# Patient Record
Sex: Female | Born: 1940 | Race: White | Hispanic: No | Marital: Married
Health system: Midwestern US, Academic
[De-identification: ages and names within clinical notes are randomized; demographics above are authoritative.]

---

## 2016-11-25 ENCOUNTER — Encounter: Admit: 2016-11-25 | Discharge: 2016-11-25 | Payer: MEDICARE | Primary: Adult Health

## 2016-11-25 DIAGNOSIS — I340 Nonrheumatic mitral (valve) insufficiency: Principal | ICD-10-CM

## 2017-01-05 ENCOUNTER — Encounter: Admit: 2017-01-05 | Discharge: 2017-01-05 | Payer: MEDICARE | Primary: Adult Health

## 2017-01-05 NOTE — Progress Notes
Records Request    Medical records request for continuation of care:    Patient has appointment on 01/21/17   with  Dr. Larwance Sachs* .    Please fax records to [facility]  [phone]    Request records:    Office Notes (Most recent)    Cardiac Office Notes (Most recent)    EKG's           Any Cardiac Testing    Any cardiac-related records    Recent Labs          Thank you,      [facility]  [facility]  [address]  [place], [place] [address]  Phone:  [phone]  Fax:  [phone]

## 2017-01-14 ENCOUNTER — Encounter: Admit: 2017-01-14 | Discharge: 2017-01-14 | Payer: MEDICARE | Primary: Adult Health

## 2017-01-14 DIAGNOSIS — I340 Nonrheumatic mitral (valve) insufficiency: ICD-10-CM

## 2017-01-14 DIAGNOSIS — I10 Essential (primary) hypertension: Principal | ICD-10-CM

## 2017-01-21 ENCOUNTER — Ambulatory Visit: Admit: 2017-01-21 | Discharge: 2017-01-22 | Payer: MEDICARE | Primary: Adult Health

## 2017-01-21 ENCOUNTER — Encounter: Admit: 2017-01-21 | Discharge: 2017-01-21 | Payer: MEDICARE | Primary: Adult Health

## 2017-01-21 DIAGNOSIS — I340 Nonrheumatic mitral (valve) insufficiency: ICD-10-CM

## 2017-01-21 DIAGNOSIS — I10 Essential (primary) hypertension: Principal | ICD-10-CM

## 2017-02-10 ENCOUNTER — Ambulatory Visit: Admit: 2017-02-10 | Discharge: 2017-02-11 | Payer: MEDICARE | Primary: Adult Health

## 2017-02-10 DIAGNOSIS — I10 Essential (primary) hypertension: Principal | ICD-10-CM

## 2017-02-10 DIAGNOSIS — I340 Nonrheumatic mitral (valve) insufficiency: ICD-10-CM

## 2017-03-05 ENCOUNTER — Encounter: Admit: 2017-03-05 | Discharge: 2017-03-05 | Payer: MEDICARE | Primary: Adult Health

## 2017-03-05 DIAGNOSIS — I10 Essential (primary) hypertension: Principal | ICD-10-CM

## 2017-03-05 NOTE — Telephone Encounter
Detailed message of results and recommendations left as pt has authorized us to leave messages about treatment, lab or procedure results on her phone voicemail.  I asked she call me back at [phone] should she have any further questions.

## 2017-03-05 NOTE — Telephone Encounter
-----   Message from Huel Coventry, RN sent at 03/04/2017  5:13 PM CDT -----      ----- Message -----  From: Laurence Aly, MD  Sent: 03/04/2017   4:57 PM  To: Renetta Chalk, RN, Huel Coventry, RN    Let pt know this echo looks good and that we have followup arranged.

## 2017-03-16 NOTE — Telephone Encounter
Talked with Dr. Geronimo Bootosamond and he states we can see pt in a year if she is comfortable with that, or in 6 months.  He will also see her sooner if she prefers.      I left a message letting pt know about follow-up options.  I said I would put the order in for a year and if she wanted to see him sooner, to please call.

## 2019-01-11 ENCOUNTER — Encounter: Admit: 2019-01-11 | Discharge: 2019-01-11 | Primary: Family

## 2019-01-11 NOTE — Progress Notes
Records Request    Medical records request for continuation of care:    Patient has appointment on 9.8.20   with  Dr. Aris Georgia.    Please fax records to [facility]  [phone]    Request records:      Recent Labs    Thank you,      [facility]  [facility]  [address] [place] [address]  Phone:  [phone]  Fax:  [phone]

## 2019-01-12 ENCOUNTER — Encounter: Admit: 2019-01-12 | Discharge: 2019-01-12 | Primary: Family

## 2019-01-17 ENCOUNTER — Encounter: Admit: 2019-01-17 | Discharge: 2019-01-17 | Primary: Family

## 2019-01-17 DIAGNOSIS — I10 Essential (primary) hypertension: Secondary | ICD-10-CM

## 2019-01-17 DIAGNOSIS — I340 Nonrheumatic mitral (valve) insufficiency: Secondary | ICD-10-CM

## 2019-01-17 DIAGNOSIS — Z1159 Encounter for screening for other viral diseases: Secondary | ICD-10-CM

## 2019-01-17 DIAGNOSIS — R0600 Dyspnea, unspecified: Secondary | ICD-10-CM

## 2019-01-24 LAB — COVID-19 (SARS-COV-2) PCR

## 2019-01-26 ENCOUNTER — Encounter: Admit: 2019-01-26 | Discharge: 2019-01-26 | Payer: MEDICARE | Primary: Family

## 2019-01-26 NOTE — Telephone Encounter
Called pt with COVID results.  No further needs at this time.

## 2019-01-27 ENCOUNTER — Encounter: Admit: 2019-01-27 | Discharge: 2019-01-27 | Payer: MEDICARE | Primary: Family

## 2019-01-27 ENCOUNTER — Ambulatory Visit: Admit: 2019-01-27 | Discharge: 2019-01-27 | Payer: MEDICARE | Primary: Family

## 2019-01-27 ENCOUNTER — Ambulatory Visit: Admit: 2019-01-27 | Discharge: 2019-01-28 | Payer: MEDICARE | Primary: Family

## 2019-02-07 NOTE — Telephone Encounter
Results and recommendations called to patient. She stated she will wait till she has any changes

## 2019-02-07 NOTE — Telephone Encounter
-----   Message from Binnie Kand, RN sent at 02/06/2019 12:08 PM CDT -----    ----- Message -----  From: Shan Levans, MD  Sent: 02/05/2019   1:17 PM CDT  To: Binnie Kand, RN    Let pt know this looks normal and arrange f/u as per office letter.

## 2020-05-09 ENCOUNTER — Encounter: Admit: 2020-05-09 | Discharge: 2020-05-09 | Payer: MEDICARE | Primary: Family

## 2020-05-09 DIAGNOSIS — I10 Essential (primary) hypertension: Secondary | ICD-10-CM

## 2020-05-09 DIAGNOSIS — R0600 Dyspnea, unspecified: Secondary | ICD-10-CM

## 2020-05-09 DIAGNOSIS — I340 Nonrheumatic mitral (valve) insufficiency: Secondary | ICD-10-CM

## 2020-05-14 ENCOUNTER — Encounter: Admit: 2020-05-14 | Discharge: 2020-05-14 | Payer: MEDICARE | Primary: Family

## 2020-05-29 ENCOUNTER — Ambulatory Visit: Admit: 2020-05-29 | Discharge: 2020-05-29 | Payer: MEDICARE | Primary: Family

## 2020-05-29 ENCOUNTER — Encounter: Admit: 2020-05-29 | Discharge: 2020-05-29 | Payer: MEDICARE | Primary: Family

## 2020-05-29 DIAGNOSIS — R0600 Dyspnea, unspecified: Secondary | ICD-10-CM

## 2020-05-29 DIAGNOSIS — I340 Nonrheumatic mitral (valve) insufficiency: Secondary | ICD-10-CM

## 2020-05-29 DIAGNOSIS — I10 Essential (primary) hypertension: Secondary | ICD-10-CM

## 2020-05-31 ENCOUNTER — Encounter: Admit: 2020-05-31 | Discharge: 2020-05-31 | Payer: MEDICARE | Primary: Family

## 2020-10-15 ENCOUNTER — Encounter: Admit: 2020-10-15 | Discharge: 2020-10-15 | Payer: MEDICARE | Primary: Family

## 2020-10-15 NOTE — Progress Notes
Records Request    Medical records request for continuation of care:    Patient has appointment  with  Dr. Rosamond .    Please fax records to [facility] [phone]    Request records:      Recent Labs        Thank you,      [facility]  [facility]  [address] [place] [address]  Phone:  [phone]  Fax:  [phone]

## 2020-10-22 ENCOUNTER — Encounter: Admit: 2020-10-22 | Discharge: 2020-10-22 | Payer: MEDICARE | Primary: Family

## 2020-10-22 DIAGNOSIS — I10 Essential (primary) hypertension: Secondary | ICD-10-CM

## 2020-10-22 DIAGNOSIS — I340 Nonrheumatic mitral (valve) insufficiency: Secondary | ICD-10-CM

## 2020-10-22 NOTE — Patient Instructions
Please call our Nursing line at [phone]

## 2020-10-23 ENCOUNTER — Encounter: Admit: 2020-10-23 | Discharge: 2020-10-23 | Payer: MEDICARE | Primary: Family

## 2020-10-23 MED ORDER — LISINOPRIL 10 MG PO TAB
10 mg | ORAL_TABLET | Freq: Every day | ORAL | 3 refills | Status: AC
Start: 2020-10-23 — End: ?

## 2020-10-23 NOTE — Telephone Encounter
Patient called the nursing office to report her home blood pressures after being elevated in the office. Patient states that when she got home her pressure was 164/98 with HR 59. At 4:30pm her BP was 150/95 with HR 61. Patient is asymptomatic.     Reviewed BP readings with Dr. Geronimo Boot. He recommends patient to start Lisinopril 10mg  daily. Patient is to monitor BP and HR over the next two weeks.           Called and left voicemail with recommendations. Left call back number for questions, comments, or concerns.

## 2021-07-25 IMAGING — MG MAMMOGRAM 3D DX, BILATERAL
8 series · 8 of 8 positions shown · non-contrast
Comparison: none

[R CC]
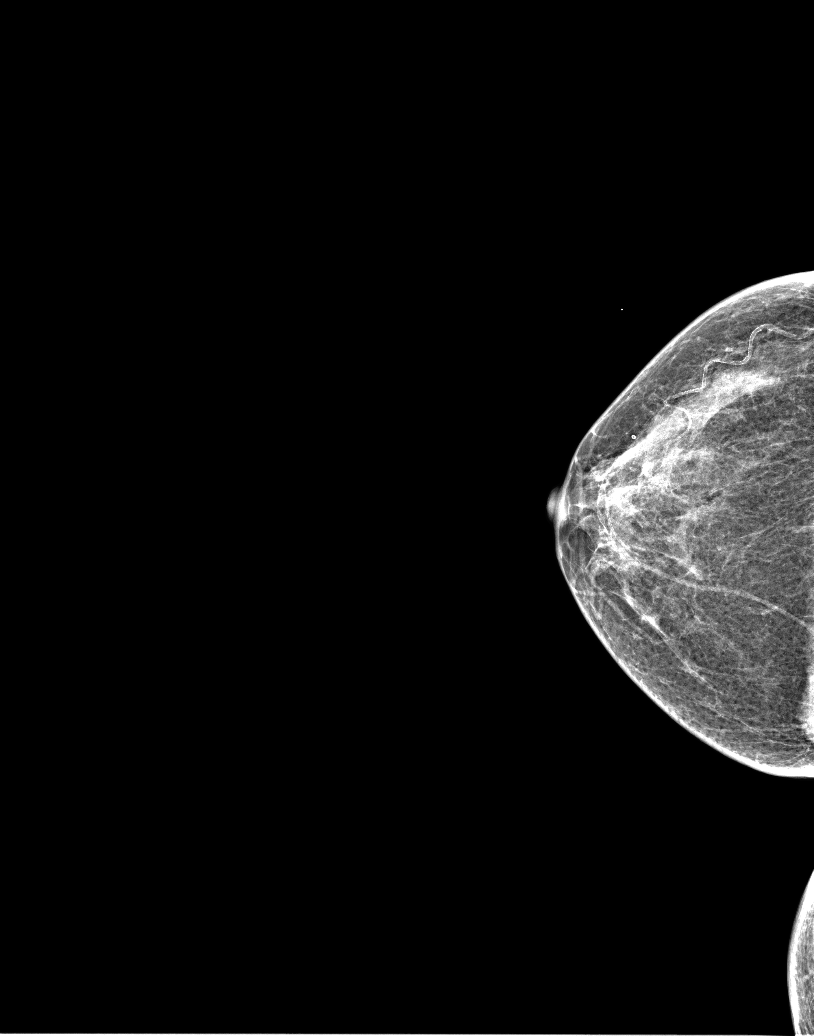

[R tomo (1 of 2)]
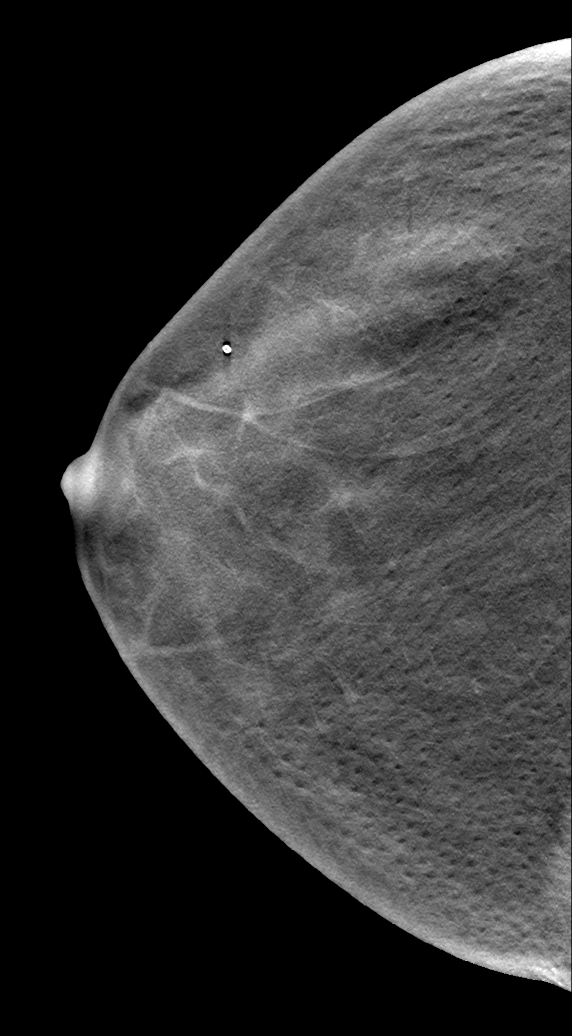

[L tomo (1 of 2)]
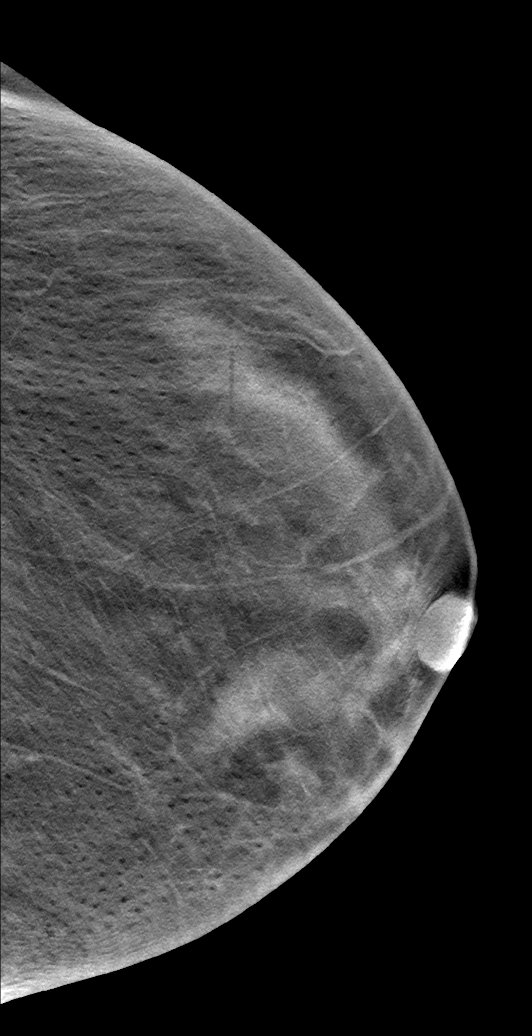

[R MLO]
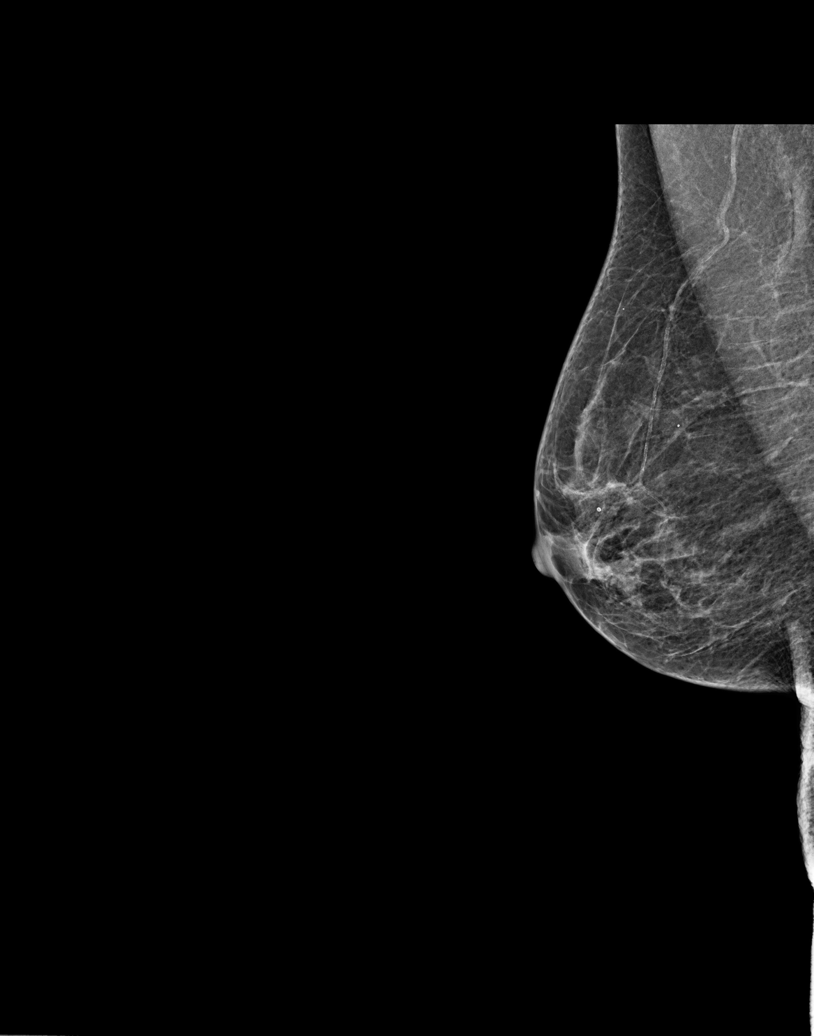

[R tomo (2 of 2)]
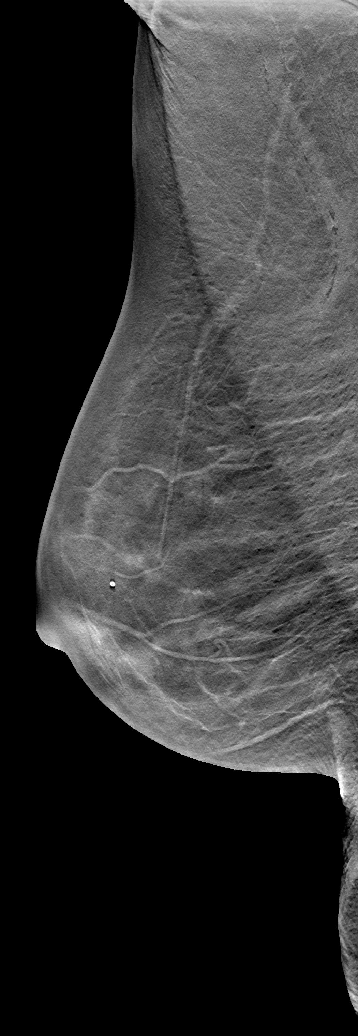

[L MLO]
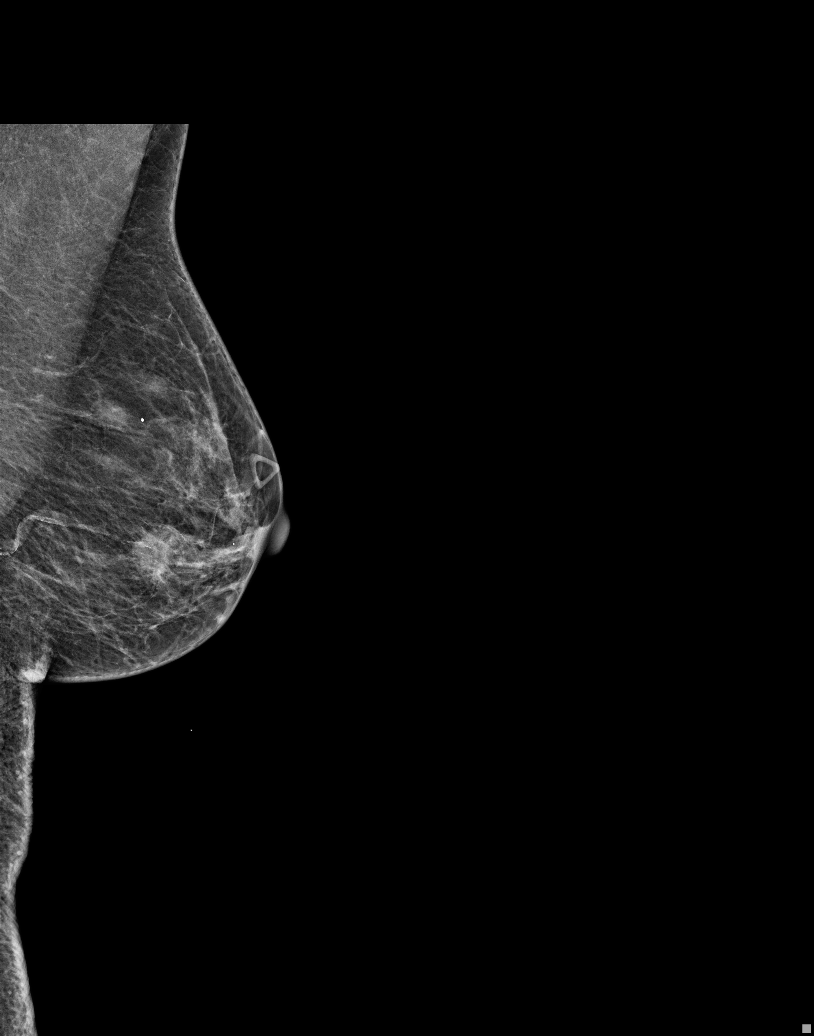

[L tomo (2 of 2)]
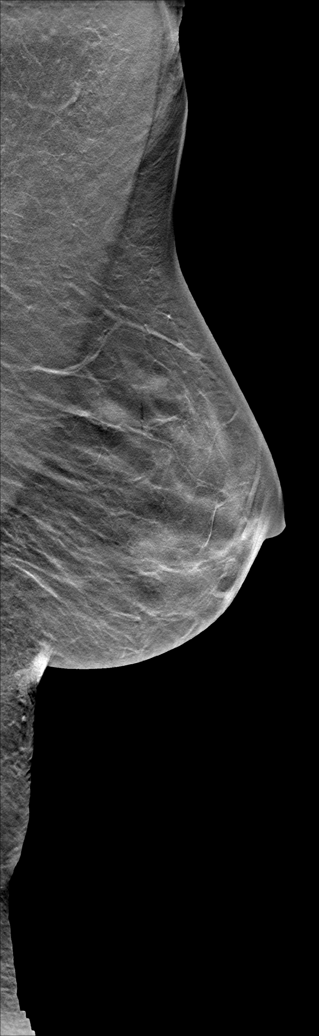

[L LM]
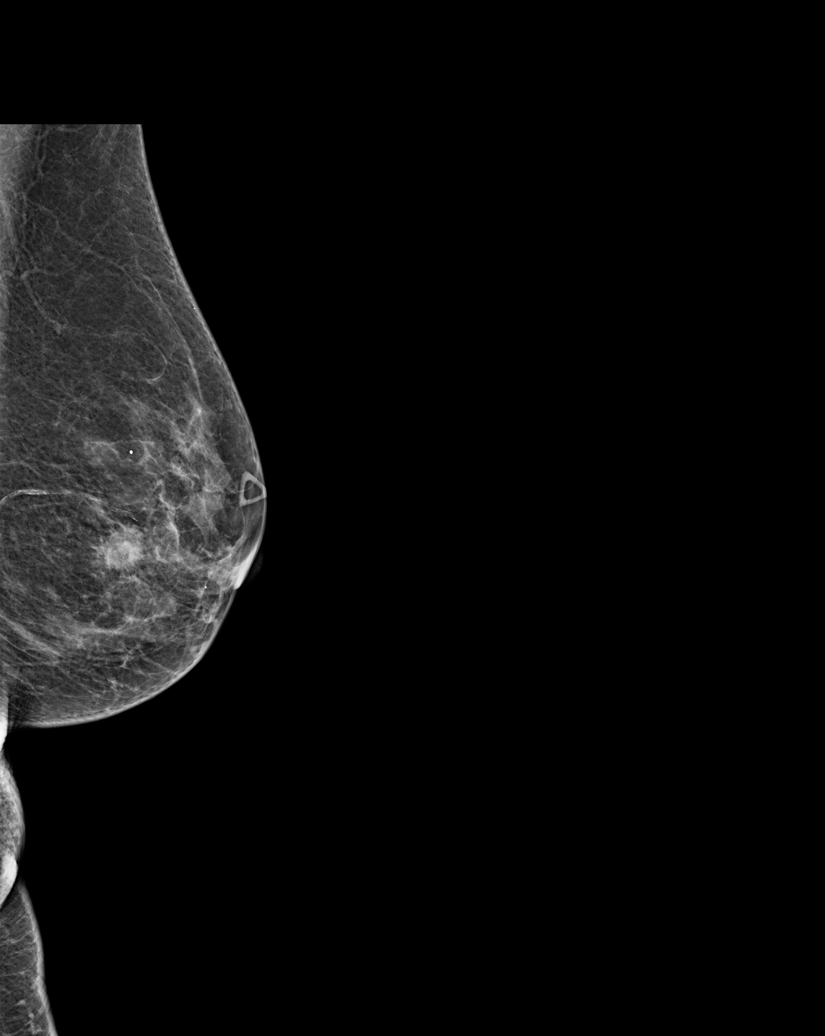

[8 of 8 positions shown; findings below may reference images not displayed]

EXAM

BILATERAL DIGITAL SCREENING MAMMOGRAM, CPT 9IRIR; WITH CAD

INDICATION

Mass of upper inner quadrant of left breast
LARGER THAN PEA SIZED LUMP, LT BREAST [DATE], X 2 MOS.  MOTHER DX @ 43.  DX BIL.  AB (3D) PRIORS:
10+ YRS AGO, NO LONGER AVAILABLE.

TECHNIQUE

Digital 2D CC and MLO projections obtained with 3D tomographic views per manufacturer's protocol.
ICAD version 7.2 was used during this exam.

COMPARISONS

There are no previous examinations available for comparison. This is a baseline study.

FINDINGS

The breasts are heterogeneously dense. This limits the sensitivity of mammography. There are no
suspicious clusters of micro calcifications.

There is a spiculated mass identified in the medial aspect of the left breast. It measures
approximately 1.3 x 1.4 x 1.8 cm. Ultrasound performed on the same day demonstrated hypoechoic
suspicious lesion.

There is no evidence of skin thickening and retraction. There is no evidence of axillary
lymphadenopathy. Benign calcifications are identified.

IMPRESSION

Spiculated mass in the medial aspect of the left breast at approximately the [DATE] position 3 cm
from the nipple measuring 1.3 x 1.4 x 1.8 cm. Further evaluation will ultrasound-guided core biopsy
and clip placement is recommended.

BI-RADS 4, SUSPICIOUS.

A reminder letter will be sent.

Biopsy should be considered.

Tech Notes:

## 2021-07-28 ENCOUNTER — Encounter: Admit: 2021-07-28 | Discharge: 2021-07-28 | Payer: MEDICARE | Primary: Family

## 2021-08-13 ENCOUNTER — Encounter: Admit: 2021-08-13 | Discharge: 2021-08-13 | Payer: MEDICARE | Primary: Family

## 2021-08-13 DIAGNOSIS — I340 Nonrheumatic mitral (valve) insufficiency: Secondary | ICD-10-CM

## 2021-08-13 DIAGNOSIS — I10 Essential (primary) hypertension: Secondary | ICD-10-CM

## 2021-08-13 NOTE — Patient Instructions
Thank you for visiting our office today.    We would like to make the following medication adjustments:  NONE      Otherwise continue the same medications as you have been doing.          We will be pursuing the following tests after your appointment today:        **Monitor blood pressure and heart rate over the next few weeks**    We will plan to see you back in 4-5 months.  Please call us in the meantime with any questions or concerns.        Please allow 5-7 business days for our providers to review your results. All normal results will go to MyChart. If you do not have Mychart, it is strongly recommended to get this so you can easily view all your results. If you do not have mychart, we will attempt to call you once with normal lab and testing results. If we cannot reach you by phone with normal results, we will send you a letter.  If you have not heard the results of your testing after one week please give [place] a call.       Your Cardiovascular Medicine [place] Team Brett Canales, Pilar Jarvis, Shawna Orleans, and [place])  phone number is [phone].

## 2021-09-22 ENCOUNTER — Encounter: Admit: 2021-09-22 | Discharge: 2021-09-22 | Payer: MEDICARE | Primary: Family

## 2021-09-22 ENCOUNTER — Emergency Department: Admit: 2021-09-22 | Discharge: 2021-09-22 | Payer: MEDICARE

## 2021-09-22 DIAGNOSIS — I340 Nonrheumatic mitral (valve) insufficiency: Secondary | ICD-10-CM

## 2021-09-22 DIAGNOSIS — I10 Essential (primary) hypertension: Secondary | ICD-10-CM

## 2021-09-22 LAB — URINALYSIS DIPSTICK REFLEX TO CULTURE
LEUKOCYTES: NEGATIVE MMOL/L (ref 21–30)
NITRITE: NEGATIVE U/L (ref 7–40)
URINE BILE: NEGATIVE mg/dL (ref 0.3–1.2)
URINE BLOOD: NEGATIVE g/dL (ref 3.5–5.0)

## 2021-09-22 LAB — URINALYSIS MICROSCOPIC REFLEX TO CULTURE

## 2021-09-22 LAB — CBC AND DIFF
ABSOLUTE BASO COUNT: 0 K/UL (ref 0–0.20)
ABSOLUTE EOS COUNT: 0.1 K/UL (ref 0–0.45)
ABSOLUTE MONO COUNT: 0.4 K/UL (ref 0–0.80)
MDW (MONOCYTE DISTRIBUTION WIDTH): 22 — ABNORMAL HIGH (ref <20.7)
WBC COUNT: 6.1 K/UL (ref 4.5–11.0)

## 2021-09-22 LAB — COMPREHENSIVE METABOLIC PANEL
ANION GAP: 10 K/UL (ref 3–12)
EGFR: >60 mL/min (ref >60)
SODIUM: 139 MMOL/L (ref 137–147)

## 2021-09-22 LAB — MAGNESIUM: MAGNESIUM: 2.1 mg/dL (ref 1.6–2.6)

## 2021-09-22 LAB — TSH WITH FREE T4 REFLEX: TSH: 13 uU/mL — ABNORMAL HIGH (ref 0.35–5.00)

## 2021-09-22 LAB — PHOSPHORUS: PHOSPHORUS: 3.8 mg/dL (ref 2.0–4.5)

## 2021-09-23 NOTE — ED Notes
To CT

## 2021-09-23 NOTE — ED Notes
Son states she has had blood work done recently and he doesn't want to do any blood work now,

## 2021-09-23 NOTE — ED Notes
Son has all belongings and will be responsible for them

## 2021-09-24 MED ADMIN — AMLODIPINE 2.5 MG PO TAB [78889]: 2.5 mg | ORAL | @ 15:00:00 | NDC 00904636961

## 2021-09-25 MED ADMIN — AMLODIPINE 2.5 MG PO TAB [78889]: 2.5 mg | ORAL | @ 15:00:00 | NDC 00904636961

## 2021-09-26 ENCOUNTER — Inpatient Hospital Stay: Admit: 2021-09-26 | Discharge: 2021-10-06 | Payer: MEDICARE | Primary: Family

## 2021-09-26 DIAGNOSIS — Z9181 History of falling: Secondary | ICD-10-CM

## 2021-09-26 DIAGNOSIS — G1221 Amyotrophic lateral sclerosis: Secondary | ICD-10-CM

## 2021-09-26 DIAGNOSIS — Z7409 Other reduced mobility: Secondary | ICD-10-CM

## 2021-09-26 DIAGNOSIS — R5381 Other malaise: Secondary | ICD-10-CM

## 2021-09-26 MED ORDER — DOCUSATE SODIUM 100 MG PO CAP
100 mg | Freq: Two times a day (BID) | ORAL | 0 refills | Status: DC
Start: 2021-09-26 — End: 2021-09-26

## 2021-09-26 MED ORDER — MAGNESIUM HYDROXIDE 400 MG/5 ML PO SUSP
30 mL | ORAL | 0 refills | Status: DC | PRN
Start: 2021-09-26 — End: 2021-09-26

## 2021-09-26 MED ORDER — OTHER MEDICATION
1 | Freq: Every day | ORAL | 0 refills | Status: DC
Start: 2021-09-26 — End: 2021-10-06

## 2021-09-26 MED ORDER — PATIENTS OWN MEDICATION
1 | Freq: Every day | ORAL | 0 refills | Status: DC
Start: 2021-09-26 — End: 2021-09-26

## 2021-09-26 MED ORDER — SENNOSIDES 8.6 MG PO TAB
2 | Freq: Every evening | ORAL | 0 refills | Status: DC
Start: 2021-09-26 — End: 2021-09-26

## 2021-09-26 MED ORDER — AMLODIPINE 5 MG PO TAB
2.5 mg | Freq: Every day | ORAL | 0 refills | Status: DC
Start: 2021-09-26 — End: 2021-10-06
  Administered 2021-09-27 – 2021-10-06 (×10): 2.5 mg via ORAL

## 2021-09-26 MED ORDER — ALUMINUM-MAGNESIUM HYDROXIDE 200-200 MG/5 ML PO SUSP
30 mL | ORAL | 0 refills | Status: DC | PRN
Start: 2021-09-26 — End: 2021-10-05

## 2021-09-26 MED ORDER — LEVOTHYROXINE 25 MCG PO TAB
25 ug | Freq: Every day | ORAL | 0 refills | Status: DC
Start: 2021-09-26 — End: 2021-09-30

## 2021-09-26 MED ORDER — ONDANSETRON 4 MG PO TBDI
4 mg | ORAL | 0 refills | Status: DC | PRN
Start: 2021-09-26 — End: 2021-10-05

## 2021-09-26 MED ORDER — ENOXAPARIN 40 MG/0.4 ML SC SYRG
40 mg | Freq: Every day | SUBCUTANEOUS | 0 refills | Status: DC
Start: 2021-09-26 — End: 2021-10-06

## 2021-09-26 MED ORDER — SODIUM CHLORIDE 0.9 % FLUSH
5-10 mL | Freq: Every day | INTRAVENOUS | 0 refills | Status: DC
Start: 2021-09-26 — End: 2021-09-29
  Administered 2021-09-27 – 2021-09-28 (×2): 10 mL via INTRAVENOUS

## 2021-09-26 MED ORDER — BISACODYL 10 MG RE SUPP
10 mg | Freq: Every day | RECTAL | 0 refills | Status: DC | PRN
Start: 2021-09-26 — End: 2021-10-06

## 2021-09-26 MED ORDER — OTHER MEDICATION
1 | ORAL | 0 refills | Status: DC | PRN
Start: 2021-09-26 — End: 2021-10-06

## 2021-09-26 MED ADMIN — AMLODIPINE 2.5 MG PO TAB [78889]: 2.5 mg | ORAL | @ 13:00:00 | Stop: 2021-09-26 | NDC 00904636961

## 2021-09-26 NOTE — Progress Notes
Patient arrived on unit via cart accompanied by transport. Patient transferred to the bed with assistance. Assessment completed, refer to flowsheet for details. Orders released, reviewed, and implemented as appropriate. Oriented to surroundings, call light within reach. Plan of care reviewed.  Will continue to monitor and assess.

## 2021-09-26 NOTE — Case Management (ED)
Notified by Particia Jasper (SW), that the patient's daughter has multiple questions re: [facility].     [id number]: Verified patient identity with two patient identifiers. Provided patient's daughter Marcelino Duster) with verbal education re: [facility].  Information included: members of the interdisciplinary team, therapy scheduling, team conference, RN to patient ration, daily physician rounds, fall prevention, appropriate clothing to have, family training, visiting hours and parking for visitors. Marcelino Duster denied any further questions and is agreeable to an admission at [facility] re[facility] facility.  Other:  1.) Marcelino Duster expressed concern that she feels her mother is "hungry" and would like staff to offer her food options and snacks, even if they are not organic. (notified charge RN of this discussion)  2.) Marcelino Duster expressed that over the past year she feels her mother has had more difficulty with talking. Marcelino Duster expressed that she felt her mother's deficits were related to motor issues than respiratory effort. "it is like her tongue is in the way and is not able to move appropriately". Marcelino Duster aware this concern will be shared with Dr. Ardelia Mems.   3.) Marcelino Duster is in agreement with email containing virtual tour of [facility] unit and further education. Patient provided email address of: [email or web address]      E.Bethzy Hauck, Inpatient Admissions Nurse/Rehab. (office# [phone] or voalte# [phone]).

## 2021-09-26 NOTE — Consults
CLINICAL NUTRITION                                                        Clinical Nutrition Initial Assessment    Name: Candace Keith   MRN: [id number]     DOB: 1941-04-03      Age: 81 y.o.  Admission Date: 09/26/2021     LOS: 0 days     Date of Service: 09/26/2021        Recommendation:  ? Continue current diet order: Regular   ? Small frequent meal attempts.   ? Order meals BID + 1/2 cartons of [employer] 1.0 or 1.4 QID.   ? Caution with oral rehydration solution, determine if contributing to caloric intake or if only filling up on fluids.  ? Trend wt weekly.   ? The nutrition-related order modifications are made in communication with the primary service, who remains responsible for the orders and overall care of the patient.     Comments:  Candace Keith is a?81 year old female?with?a past medical history of hypertension and mitral valve regurgitation/insufficiency.?Patient was?admitted on 09/22/2021 with chief complaint of generalized weakness, debility, failure to thrive, speech changes, and?frequent falls. ?Patient was brought to the [facility] by her son. Neurology was consulted. ?No evidence of stroke. 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    RD consulted on admit to rehab. Pt seen by RD during acute admission on 5/18. Met with pt and son Candace Keith at bedside this date. Discussed availability of Molli Posey 1.0 and 1.4 supplements on the unit and how to request from staff. Pt has not tried them yet and is unsure of whether she would want them room temperature or cold. Son mixing AMPED Hydrate for pt at time of RD visit. Reports pt drinks ~12 fl oz of this daily. Dietary manager met with pt and son as RD leaving to discuss foods rehab facility able to provide. Pharmacist planning to meet with pt and son later to further discuss dietary supplements wanting to be taken while admitted. Pt at acute nutritional risk and meeting malnutrition criteria.     5/18 Assessment: RD met with pt and her son at bedside. Pt able to express thoughts through pen and paper, her son participated in majority of assessment taking. He has lived with his mother for 5 yrs and more recently (2021) started encouraging an organic diet. Over the past few months-1 yr, pts appetite has declined. Went from x3 solid meals to 1-2 meals per day. Length of time to complete meals has gone from ~10 minutes to ~30 minutes most days. Pt reports fullness/ early satiety. Noted significant unintentional wt loss the past few months, pt is quite diligent with weighing herself. Total wt loss of ~18% from UBW, x2 yrs. More recently wtl oss of ~6.5% x 1 month. Pt takes many supplements, son has a list on his phone, advised him to review this with rehab team. Utilizes what sounds to be an oral rehydration solution daily, has ~16 oz of this daily. Drinks x2 protein shakes daily (PTA) Isalean PRO, 36 g protein, 280 kcal. Son also mentions use of raw goats milk daily, unsure of quantity.  Interested in utilizing organic based oral supplements, offered kate farms products as well as compleat organic blends. Discussed ONS use with inpatient rehab dietitian.     Height, Weight, BMI BMI (kg/m2) Height Weight   09/26/2021 19.76 158 cm 108 lb 0.4 oz   09/23/2021 18.55 158 cm 101 lb 6.6 oz   08/13/2021 19.75 158 cm 108 lb   10/22/2020 20.78 158 cm 113 lb 9.6 oz   05/29/2020 22.35 158 cm 122 lb 3.2 oz   05/09/2020 22.79 158 cm 124 lb 9.6 oz       Nutrition Assessment of Patient:  Admit Weight: 49 kg (5/19);  ;    BMI (Calculated): 19.76; BMI Categories Adult: Acceptable: 18.5-24.9; Appearance: Thin    Pertinent Allergies/Intolerances: none   Pertinent Labs: Vit >99, B12 499; Pertinent Meds: AMPED Hydrate, Protandim NAD/NRF1/NRF2 Synergizer, synthroid; Unintentional Weight Loss: > 5% in 1 month (severe)  Oral Diet Order: Regular; Oral Supplement: Other (Comment) [employer] 1.0 or 1.4)     Current Oral Intake: Inadequate           Estimated Calorie Needs: 1470-1715 kcals (30-35 kcals/kg current wt)  Estimated Protein Needs: 58-73 grams (1.2-1.5 g/kg current wt)    Malnutrition Assessment:   Malnutrition present on admission  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 Meals BID + 1/2 carton of kate farms 1.4 or 1.0, 4x per day.    Nutrition Focused Physical Assessment:   Loss of Subcutaneous Fat: Yes; Severity: Moderate; Location: Orbital  Muscle Wasting: Yes; Severity: Moderate; Location: Temple, Clavicle  Edema: No;  ;       Pressure Injury: none          Nutrition Diagnosis:  Unintended weight loss  Etiology: predicted suboptimal protein/energy intake in realtion to declining functional status  Signs & Symptoms: 6% unintentional wt loss x 1 month. Pt report                      Intervention / Plan:  Monitor labs, meds, intakes, and wt trends.          Goals:  Patient to consume >50% of meals/supplements  Time Frame: Throughout stay  Prevent further weight loss  Time Frame: Throughout stay             Silvio Clayman, MS, RDN, LD  Clinical Dietitian - Dept of Clinical Nutrition   Available on Voalte

## 2021-09-26 NOTE — Rehab Pre-Admission Screening
Physical Medicine & Rehabilitation Pre-Admission Screening    Candace Keith is a 81 y.o.  female.     DOB: 24-Jul-1940                  MRN#:  [id number]  Primary Insurance: [facility] MEDICARE  Financial Class: Medicare Repl  Date of Hospital Admission:  09-22-2021  Date of Expected Rehab Admission:  09-25-2021  Precautions:  Fall  Weight bearing Precautions:  [facility] Course:       Candace Keith is a 81 year old female with a past medical history of hypertension and mitral valve regurgitation/insufficiency. Patient was admitted on 09/22/2021 with chief complaint of generalized weakness, debility, failure to thrive, speech changes, and frequent falls. ?Patient was [facility] to the [facility] by her son. Neurology was consulted. ?No evidence of stroke. 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   ?  Patient has been working with physical and occupational therapy since 09-23-2021 and has been making functional gains. Patient is anticipated to return to home at the stand by assistance level of care.         Prior Level of Function         Self-Care/ADLs: Independent with homemaking w/ ambulation  Mobility: Independent Mobility in Community without Device  Work/Personal Responsibilities/Hobbies:  n/a  ?  Home Environment:  Home Situation: Lives with Family (09/24/2021  2:03 PM)   Patient Owned Equipment: Bernardo Heater (09/24/2021  2:03 PM)   Type of Home: House (09/24/2021  2:03 PM)   Entry Stairs: 1-2 Flights of Stairs; Rail on Both Sides (09/24/2021  2:03 PM)   In-Home Stairs: 1-2 Flights of Stairs; Rail on 1 Side; Able to Live on One Level (09/24/2021  2:03 PM)      Support System:  Son lives w/ pt and can provide 24/7 support         Current Level of Function    Physical Therapy: 09-25-2021  Bed Mobility/Transfer  Bed Mobility: Supine to Sit: Minimal Assist;Verbal Cues;Head of Bed Elevated;Use of Rail;Requires Extra Time;Safety Considerations;Assist with Trunk  Bed Mobility: Sit to Supine: Minimal Assist;Verbal Cues;Bed Flat;Use of Rail;Requires Extra Time;Safety Considerations  Transfer Type: Sit to/from Stand  Transfer: Assistance Level: To/From;Bed;Minimal Assist  Transfer: Assistive Device: Nurse, adult  Transfers: Type Of Assistance: Materials engineer;For Balance;For Strength Deficit;For Safety Considerations;Requires Extra Time  End Of Activity Status: Nursing Notified;Instructed Patient to Request Assist with Mobility;Instructed Patient to Use Call Light;In Bed  ?  Activity/Exercise  Exercise: Standing;RLE;LLE  Balance Exercise: Standing;Dynamic;Minimal Assist;Marching               Comments: Session focused on standing balance and LE strengthening. Intermittent UE use to maintain standing balance. physical assistance required without UE use to maintain balance. Exercises and activities include: mini squats, LE abduction, marches, and static standing balance.    Occupational Therapy: 09-25-2021  ADL Mobility  Bed Mobility: Sit to Supine: Standby assist;Verbal cues  Bed Mobility Comments: verbal cues for proper positioning  Transfer Type: Sit to/from stand  Transfer: Assistance Level: To/from;Bedside chair;Bed;Minimal assist (CGA)  Transfer: Assistive Device: Roller walker  End of Activity Status: In bed;Instructed patient to request assist with mobility;Instructed patient to use call light;Nursing notified  Transfer Comments: min A from lower surfaces  Sitting Balance: Static sitting balance;Dynamic sitting  balance;Standby assist  Standing Balance: Static standing balance;Dynamic standing balance;Minimal assist;2 UE support  Gait Distance: 120 feet  Gait: Assistance Level: Minimal assist  Gait Comments: pt prefers to remain in room this date. She walks to/from bathroom and stands ~7 minutes for grooming routine, then ambulates around room with min A and verbal cues for proper RW positioning.  ?  Activity Tolerance  Endurance: 3/5 Tolerates 25-30 Minutes Exercise w/Multiple Rests  Comment: decreased endurance throughout session noted  ?  Cognition  Overall Cognitive Status: WNL  Comprehension: WFL to Adequately Complete Self Care Tasks Safely  Expression: Increased Time for Expression;Mumbling/Slurred  Social Interaction: Increased Time to Adjust  Problem Solving: Direction Following Assist;Cueing to Sequence Task  Orientation: Alert & Oriented x3  Attention: Awake/Alert  Cognition Comment: Pt follows commands 100% of time but is limited by pain, weakness and requires extra time to verbalize and requires extra time for intitiation of tasks  ?  ROM  R UE ROM: WFL  L UE ROM: WFL  Thumb/Finger ROM: WFL  R LE ROM: WFL  R LE ROM Method: Active  L LE ROM: WFL  L LE ROM Method: Active  ?  Sensory  Overall Sensory: Pt Perceives Pressure in Both UEs in Gross Exam  ?  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 Will continue to monitor NIF/VC while at [facility].   2. Advanced Age: Patient is at a greater risk for falls, decreased functional reserve, skin integrity, as well as delirium.  3. Falls:  Patient will be placed close to the nurse's station for closer monitoring. Staff will round on the patient frequently to assess for any needs, such as using the restroom to help prevent falls as this increases the patient's risk for morbidity/mortality and 30 day readmission to the hospital rate.   4. Hypertension: Patient is at risk for hyper/hypotension during aggressive mobilization program with therapies, and requires daily physician monitoring and adjustment of medications.  Will continue to monitor and manage for optimal blood pressure control and resume home medications as able.  5. Hypothyroidism: Will continue to monitor and manage, and adjust medications as needed.  6. Risk for Pain: There is a risk of uncontrolled pain, risk of failure to participate in therapies, and risk of sedation due to pain medications.? This will require daily medication adjustments to find the balance between meaningful participation in therapies and pain control, avoid the potential for addiction, while facilitating the rehabilitation for their condition. Will continue to monitor and adjust medication regimen in order to maximize pain relief.   7. Risk for Thrombosis: Patient has Lovenox, sequential compression devices ordered. Patient will be encouraged to ambulate frequently with the assistance of health care provider.               Patient will receive Physical therapy and  Occupational therapy each 90 minutes a day , 5 days a week for a total of 3 hours daily (minimum) for the duration of the rehabilitation stay within an interdisciplinary rehabilitation program with case manager/social worker, dietician, neuropsychologist, rehab nursing and PM&R oversight.  ?  Rehabilitation Prognosis: Fair to good  Medical Prognosis: The patient is deemed medically stable to tolerate, benefit from, and participate in IRF level services.  Tolerance for three hours of therapy a day: Good. Patient has participated well with therapies since 09-23-2021 in the acute care setting and is anticipated to tolerate therapy as required.   ?  Goals/Barriers/Facilitators  Family / Patient Goals: return home with family assistance  Mobility Goals: Overall goal is Supervision or touching assistance and Physical Therapy will evaluate and treat ambulation/wheelchair use and bed transfers  Activities of Daily Living (ADLs) Goals: Overall goal is Setup or clean-up assistance and Occupational therapy will evaluate and treat basic Activities of Daily Living  Cognition / Communication Goals: Cognition grossly intact and Speech intact            Barriers & Interventions:   Caregiver Apprehension: Arrange caregiver support and discuss barriers and patient progress with caregivers when appropriate.  High Burden of Care: Initiate interdisciplinary rehabilitation to improve functional independence and reduce burden of care.  Home Accessibility: Work with family to obtain home measurements of doorways, bathroom access, and stair set-up to plan for appropriate adaptive equipment and home modifications as necessary.  Medication Education:  Pharmacist and nursing staff to provide education to patient and family regarding medication side effects, special precautions, and safe administration.  Facilitators: good family / social support, patient motivation and improving strength / endurance  ?  Discharge Planning  Expected Length of Stay 10 day(s)  Expected Discharge Disposition Home            Romie Minus, BSN, RN

## 2021-09-27 NOTE — Progress Notes
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  Patient had not eaten yet.  Set patient up to eat during PLOF exam   Prior Function   Prior Functioning: Indoor Mobility (Ambulation) 3   Prior Functioning: Stairs 3   Home Environment   Home Situation Lives with Family   Type of Home House   Patient Futures trader walker;4 wheeled walker   Entry Stairs Details 5, platform 3, (8) railing on left going up   In Home Stairs Details 11, railing on left going up, wall on other side   Comment Will need clarification on home set up.  Patient's son not present for evaluation.  Per acute admission: Son present during evaluation to provide PLOF. He reported to be living with pt long term after hospital stay. Son reports that patient was independent and very active in her garden about 1 month prior to admission. Patient has progressively been declining in function due to LLE weakness and impaired balance. Patient was previously attending outpatient physical therapy to assist with strengthening but has progressed.   Range of Motion   R LE ROM WFL   L LE ROM WFL   Strength   R Hip Flexion 4+/5   L LE Strength Not WFL   L Hip Flexion 3+/5   L Knee Flexion 4+/5   L Knee Extension 4+/5   L Ankle Dorsiflexion 2+/5   Sensation/Tone/Coordination   LLE Sensation/Proprioception Intact Light Touch   RLE Sensation/Proprioception Intact Light Touch   Bed Mobility   Bed Mobility: Supine to sit assist level Moderate assistance   Bed Mobility: Supine to sit type of assist Assist with Bilateral lower extremities   Bed Mobility: Sit to Supine Assist Level Moderate assistance   Bed Mobility: Sit to supine type of assist Assist with bilateral lower extremity   Transfers   Transfer: Assistive Device Roller Walker   Transfer: Sit to stand assist level Moderate assistance;Minimum assistance  (moderate assist from bed)   Transfer: Sit to stand type of assist Facilitation of trunk;Facilitation of weight shift   Transfer: Stand to sit assist level Minimum assistance   Transfer: Stand to sit type of assist For safety   Transfer: Stand pivot assist level Minimum assistance   Transfer: Stand pivot type of assist For safety   Gait   Gait Distance 150 feet  (150, 50, 20)   GAIT: Assist Level Minimum assistance   GAIT: Type of Assist For safety;Facilitation of trunk   Gait: Hospital doctor   Gait: Patterns Decreased gait velocity;Festinating   Gait: Deviation Left Decreased stride length   Gait: Deviation Right Decreased stride length   Activity Limited By Complaint of fatigue   Activity   PT Therapeutic Activities IRF PAI, outcome measures   Outcome Measures   10 Meter Walk Assessed   10 Meter Walk: Average Gait Speed (seconds) 131.38   10 Meter Walk: Average Gait Speed (meters/second) 0.08   10 Meter Walk: Comfortable Gait Speed:  Device Used [employer] walker   10 Meter Walk: Comfortable Gait Speed: Comment min assist   Berg Balance Scale Assessed   Sit to Stand 1   Standing Unsupported 1   Sitting w/o Back Support, Feet Supported 4   Stand to Sit 1   Transfers 1   Standing Unsupported Eyes Closed 0   Standing Unsupported Feet Together 0   Reaching Forward While Standing 0   Pick Up Object From Floor While Standing 0   Standing, Looking Over L/R Shoulders 0   Turn 360 Degrees 0   Alternate Foot on Step, Standing Unsuppo 0   Tandem Standing 0   Single Leg Stance 0   Berg Balance Score 8 out of 56   Berg Fall Risk At Risk for Falls   Timed Up and Go Assessed   Timed Up and Go: Standard (seconds) 111.39   Timed Up and Go: Manual (seconds) 3   Timed Up and Go: Assistance Used Minimum assistance   Timed Up and Go: Assistive Device used [employer]   Timed Up and Go Fall Risk  At risk for fallsCommunity dwelling adults >13.5* Shumway-Cook et al, 2000   Assessment   Assessment Patient presents to inpatient rehab after concern for ALS.  Patient with significantly slow gait speed suggesting the need for therapy to reduce fall risk.  Due to patient's diagnosis family education oin ALS will be required.  Patient will also need an assessment for appropriate equipment.  Patient will continue to require therapy to ensure a safe discharge.   Plan   Comments Stairs, ALS education, family training, Funcitonal mobility   Recommendations   PT Discharge Recommendations Home with Assistance;and;Home Health Setting   Equipment Recommendations Too early to be determined   Education   Persons Educated Patient   Interventions Repetition of Instructions   Teaching Methods Verbal Instruction   Patient Response Verbalized Understanding;More Instruction Required   Topics Plan/Goals of PT Interventions;Use of Assistive Device/Orthosis;Mobility Progression;Exercise Program;Safety Awareness;Importance of Increasing Activity;Recommend Continued Therapy   Weekly Goals   Weekly Bed Mobility Goals Patient will perform sit to supine with;Patient will perform supine to sit with   Patient will perform sit to supine with Minimum assistance   Patient will perform supine to sit with Minimum assistance   Weekly Transfer Goals Patient will complete sit to stand transfer with;Patient will complete stand to sit transfer with;Patient will complete stand pivot transfer with   Patient will complete sit to stand transfer with Stand by assistance   Patient will complete stand to sit transfer with Stand by assistance   Patient will complete stand pivot transfer with Stand by assistance   Weekly Ambulation/Stairs Goals Patient will ambulate;Patient will ascend/descend   Patient will ambulate 150 feet;Least assistive device;Stand by assistance   Patient will ascend/descend 4 stairs;Minimum assistance;1 rail   Goal(s) for the Stay   Patient will perform at ambulation level;household mobility;Independent     Pam Drown PT, DPT

## 2021-09-27 NOTE — Progress Notes
Physical Medicine & Rehabilitation Progress Note       Today's Date:  09/26/2021  Admission Date: 09/26/2021  LOS: 0 days  Insurance: [facility] MEDICARE    Principal Problem:    ALS (amyotrophic lateral sclerosis) (HCC)  Active Problems:    Essential hypertension    Mitral valve insufficiency    Declining functional status    Severe malnutrition (HCC)    Risk for falls    Impaired mobility and activities of daily living    Dysarthria                       Assessment/Plan:     Candace Keith is a 81 y.o.  female admitted to [facility] on 09/26/2021 with the following issues: debility due to ALS    Rehabilitation Plan  Tentative discharge date:   Rehabilitation: Patient will continue with comprehensive therapies including physical therapy, occupational therapy, speech & language pathology, specialized rehab nursing, neuropsychology and physiatry oversight.    Goals:    Recommended therapy after discharge:    PT recommended equipment:    OT recommended equipment:      Daily Functional Update:  Transfers        Transfer: Assistive Device: Roller Walker (09/25/2021  3:03 PM)    No data recorded  No data recorded  No data recorded   Gait/ Mobility        Gait: Assistive Device: Roller Walker (09/24/2021  2:03 PM)    No data recorded  Gait Distance: 15 feet (x2 bouts) (09/24/2021  2:03 PM)    No data recorded  No data recorded   Toileting        No data recorded  No data recorded  No data recorded   Dressing LE Dressing Assist: Moderate Assist (09/25/2021  1:43 PM)    No data recorded       Current Medical Problems/Risks of Medical Complications/Management    ALS(meets criteria for definite-ALS per Neurology)  Tetraparesis, Dysarthria/bulbar weakness  Fasciculations, hyperreflexia  Fall Risk  Impaired mobility and ADL  - Riluzole has been discussed with patient and son wants to hold on medication addition   - PT/OT/SLP to eval and treat  - Outpatient referral for ALS clinic when discharged (scheduled by neuro)  - Neuropsych to eval and treat  ?  HTN  - Home amlodipine 2.5mg  M/W/F   - May consider increasing amlodipine to daily   ?  Hypothyroidism   - Continue PTA synthroid  Free T4 09/22/21 0.8-wnl  ?  Mitral regurgitation.   follows with Dr. Geronimo Boot with [place] [facility]  ?  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 Has appt with oncologist opt.  - Continue to monitor   ?  Severe malnutrition:  Malnutrition Assessment:?  Malnutrition present on admission  ICD-10 code E43: Chronic illness/Severe malnutrition  ??????Weight loss: >?5% x 1 month, Energy intake: 75% or less of estimated energy requirement for 1 month or more ?  Malnutrition Interventions: Small frequent meals/snacks q2-3 hrs, 6 total. Meals BID + 1/2 carton of [employer] 1.4 or 1.0, 4x per day.  Our dietician will continue to follow.  ?  Pain Management: pain is well controlled, heat / ice PRN, topical medication and Tylenol PRN  ?  Skin: There are no pressure sores currently. and Encourage the patient to continue to inspect their skin and perform pressure relief.  ?  Bowel: continent of bowel stool softeners added  ?  Bladder: continent of bladder, Post-void bladder scan x3 until <155ml and Intermittent straight catheterization for >334ml  ?  BMI: 18.55kg/m2  - Consult dietician for concerns of malnutrition.   ?  Mental Health: consult neuropsychology to provide support / counseling    DVT Prophylaxis: enoxaparin (LOVENOX) syringe 40 mg  QDAY(21)    Darliss Cheney, MD         Subjective     Pt seen and examined with Dr. Dairl Ponder. Vitals reviewed and patient is hemodynamically stable with continued elevated SBP. Having adequate bowel and bladder function. Denies acute complaints or concerns.     Will continue to monitor BP and increase dose if SBP trends upward.     Patient's son with several questions regarding prognosis and follow up in ALS clinic. Answered questions as able and assured patient that primary team would arrange appropriate follow ups prior to discharge.     Objective                          Vital Signs: Last Filed                 Vital Signs: 24 Hour Range   BP: 148/78 (05/19 2100)  Temp: 36.3 ?C (97.4 ?F) (05/19 2100)  Pulse: 70 (05/19 2105)  Respirations: 18 PER MINUTE (05/19 2105)  SpO2: 96 % (05/19 2105)  O2 Percent: 21 % (05/18 2230)  O2 Device: CPAP/BiPAP (05/19 2105)  Height: 157.5 cm (5' 2) (05/19 1311) BP: (148-170)/(78-93)   Temp:  [36.3 ?C (97.4 ?F)-37.3 ?C (99.1 ?F)]   Pulse:  [68-74]   Respirations:  [18 PER MINUTE-20 PER MINUTE]   SpO2:  [94 %-98 %]   O2 Percent:  [21 %]   O2 Device: CPAP/BiPAP     Vitals:    09/26/21 1311   Weight: 49 kg (108 lb 0.4 oz)       Intake/Output Summary:  (Last 24 hours)  No intake or output data in the 24 hours ending 09/26/21 2222     Last Bowel Movement Date: 09/25/21    Labs--reviewed  No results found for this visit on 09/26/21 (from the past 24 hour(s)).    Medications:  Scheduled Meds:[START ON 09/27/2021] amLODIPine (NORVASC) tablet 2.5 mg, 2.5 mg, Oral, QDAY  enoxaparin (LOVENOX) syringe 40 mg, 40 mg, Subcutaneous, QDAY(21)  [START ON 09/27/2021] levothyroxine (SYNTHROID) tablet 25 mcg, 25 mcg, Oral, QDAY(07)  Protandim NAD Synergizer (Patient's Own Medication), 1 Dose, Oral, QDAY  Protandim NRF1 Synergizer (Patient's Own Medication), 1 Dose, Oral, QDAY  Protandim NRF2 Synergizer (Patient's Own Medication), 1 Dose, Oral, QDAY  sodium chloride PF 0.9% flush 5-10 mL, 5-10 mL, Intravenous, FLUSH Daily    Continuous Infusions:  PRN and Respiratory Meds:aluminum/magnesium hydroxide Q4H PRN, other medication PRN, bisacodyL QDAY PRN, ondansetron Q6H PRN       Physical Exam  General: Alert, conversant, NAD  HEENT: NCAT, oral mucosa moist   Heart: Extremities appear well perfused  Lungs: non labored breathing. Symmetrical chest expansion  Abdomen: non-distended  Extremities: no pedal edema  Neuro: Alert, follows commands. Appropriate processing time with conversation.   Psych: pleasant mood, appropriate affect          Labs & Therapy Notes Reviewed    Darliss Cheney, MD

## 2021-09-27 NOTE — Progress Notes
OCCUPATIONAL THERAPY       09/27/21 1300   Time Calculation   Start Time 1308   Stop Time 1415   Time Calculation 67   $$ OT Eval - High Complexity 1 Procedure   $$ Professional Contact 2 units   History   Reason For Admission Patient was admitted on 09/22/2021 with chief complaint of generalized weakness, debility, failure to thrive, speech changes, and frequent falls.  Patient was brought to the [facility] by her son. Neurology was consulted.  No evidence of stroke.  Ongoing work-up for neuromuscular disorder, concern for ALS.  EMG completed - results consistent with ALS. Patients hospital course has been complicated weakness, as well as impaired mobility and activities of daily living.   Previous Medical History hypertension and mitral valve regurgitation/insufficiency   Subjective   Subjective Pt seated in wheelchair and accompanied by her son, Theron Arista.  Pt agreeable to OT session.   Cognitive   Patient Behavior Calm;Cooperative   Family Behavior Supportive   Prior Function   Prior Functioning: Self Care 2  (assist from son when incontinent)   Prior Functioning: Functional Cognition 3   Leisure/Hobbies spending time outside; gardening   Fall History Per son, quite a few, but less than a dozen within the past 6 to 9 months, possibly a year. Falls occurred outside, inside, at church, down steps, etc.   Driving History Pt drove independently prior to admission.   Hand Dominance Right   Home Environment   Home Situation Lives with Family   Type of Home House   Patient Futures trader walker;4 wheeled walker   Entry Stairs Details 5, platform 3, (8) railing on left going up   In Home Stairs Details 11, railing on left going up, wall on other side   Financial risk analyst / Tub Tub/Shower Unit   Comment Pt's son assisting with providing the answers to the above questions due to pt's severe dysarthria.   Eating   Eating Comments Not addressed   Grooming   Grooming - Brush Teeth Assist No   Grooming Assist Minimal Assist   Grooming Position Standing for ___ % of task   Grooming Comments Standing with steadying assist and requires assist to open toothpaste tube due to hand weakness.   Bathing   Bath/Shower Soap and water shower/bath   Bathing - Chest Assist No   Bathing - Left Arm Assist No   Bathing - Right Arm Assist No   Bathing - Abdomen Assist No   Bathing - Perineal Area Assist No   Bathing - Buttocks Assist Yes   Bathing - Left Upper Leg Assist No   Bathing - Right Upper Leg Assist No   Bathing - Left Lower Leg Including Foot Assist Yes   Bathing - Right Lower Leg Including Foot Assist Yes   Bathing Position Shower - sitting   Bathing Equipment Hand held shower;Shower chair with back;Washcloth;Grab bars - front   Bathing Comments Set up and verbal reminders to remain seated.   Upper Body Dressing   UE Dressing Assist Stand By Assist   Upper Dressing Position Sitting in chair   Upper Dressing Comments Setup for pull over shirt   Lower Body Dressing   LE Dressing Assist Maximum Assist   Lower Dressing Position Sitting in chair   Lower Dressing Comments OT assist with reaching feet intermittently during dressing for threading BLEs and donning socks.  Pt stands with assist and pulls over hips with minimal assist.  Toileting   Toileting-Adjusting clothing BEFORE using toiet, commode, bedpan or urinal Assist Yes   Toileting-Wipe Self Assist No   Toileting-Adjust clothing AFTER using toilet, commode, bedpan or urinal   (Not addressed due to pants being doffed on toilet in preparation for shower.)   Toileting Assist Moderate Assist   Toileting Position Seated   Toileting Comments Incontinence noted on brief   Toileting Transfer   Toilet Transfer Technique Posterior transfer onto receptacle   Toilet Transfer Assist Facilitation of trunk;For safety;Minimum assistance   Acupuncturist Grab bar - left   Daily Care   Patient continent of bladder? Yes, void   Urine Function Void;Incontinence   Transfers Transfer: Hospital doctor   Transfer: Sit to stand assist level Minimum assistance;Moderate assistance  (Assist level varied between minimal and moderate)   Transfer: Sit to stand type of assist Facilitation of trunk;Facilitation of weight shift;For safety   Transfer: Stand to sit assist level Minimum assistance   Transfer: Stand to sit type of assist For safety   Transfer: Stand pivot assist level Minimum assistance   Transfer: Stand pivot type of assist For safety   Assessment   Assessment Pt presents with progressive weakness, dysarthria, and history of numerous falls within the past 6 to 9 months.  Pt currently demonstrates impaired performance in ADLs and IADLs.  Pt lives with her son and will benefit from skilled OT for DME recommendations, energy conservation education, and family training to maximize safety with ADL performance.  Pt will likely discharge to home with consistent caregiver assist for ADLs and IADLs.   Plan   OT Plan Family / caregiver training;Functional transfers training;Postural control management;Other (Comment);Self-care retraining  (energy conservation)   Plan Comments Assess grip strength; 9 hole peg test   Goals for the stay   Pt will perform basic care and transfer with Minimum assistance   Type of Note   Type of Note Evaluation     Henrene Dodge, OTR

## 2021-09-27 NOTE — Progress Notes
RT Adult Assessment Note    NAME:Candace Keith             MRN: [id number]             DOB:07-Apr-1941          AGE: 81 y.o.  ADMISSION DATE: 09/26/2021             DAYS ADMITTED: LOS: 0 days    RT Treatment Plan:       Protocol Plan: Procedures  PEP Therapy: Place a nursing order for "IS Q1h While Awake" for any of Lung Expansion indicators  CPAP/BiPAP: BiPAP (per MD)  SpO2: Discontinued  Comment: .    Additional Comments:  Impressions of the patient: Patient placed on BiPAP for the night (settings of 10/5). Resting comfortably on RA.  Intervention(s)/outcome(s): Eval  Patient education that was completed: N/A    Vital Signs:  Pulse: 70  RR: 18 PER MINUTE  SpO2: 96 %  O2 Device: CPAP/BiPAP  Liter Flow:    O2%:    Breath Sounds: Clear (Implies normal);Decreased  Respiratory Effort: Unlabored

## 2021-09-28 NOTE — Progress Notes
Physical Medicine & Rehabilitation Progress Note       Today's Date:  09/28/2021  Admission Date: 09/26/2021  LOS: 2 days  Insurance: [facility] MEDICARE    Principal Problem:    ALS (amyotrophic lateral sclerosis) (HCC)  Active Problems:    Essential hypertension    Mitral valve insufficiency    Declining functional status    Severe malnutrition (HCC)    Risk for falls    Impaired mobility and activities of daily living    Dysarthria                       Assessment/Plan:     Candace Keith is a 81 y.o.  female admitted to [facility] on 09/26/2021 with the following issues: debility due to ALS    Rehabilitation Plan  Tentative discharge date:   Rehabilitation: Patient will continue with comprehensive therapies including physical therapy, occupational therapy, speech & language pathology, specialized rehab nursing, neuropsychology and physiatry oversight.    Goals: at ambulation level, household mobility, Independent  Recommended therapy after discharge: Home with Assistance, and, Home Health Setting  PT recommended equipment: Too early to be determined  OT recommended equipment:      Daily Functional Update:  Transfers        Transfer: Assistive Device: Roller Walker (09/27/2021  1:00 PM)    Transfer: Sit to stand assist level: Minimum assistance; Moderate assistance (Assist level varied between minimal and moderate) (09/27/2021  1:00 PM)    Transfer: Stand pivot assist level: Minimum assistance (09/27/2021  1:00 PM)    No data recorded   Gait/ Mobility        Gait: Assistive Device: Roller Walker (09/27/2021  8:00 AM)    GAIT: Assist Level: Minimum assistance (09/27/2021  8:00 AM)    Gait Distance: 150 feet (150, 50, 20) (09/27/2021  8:00 AM)    No data recorded  No data recorded   Toileting        Toileting Assist: Moderate Assist (09/27/2021  1:00 PM)    No data recorded  Toilet Transfer Assist: Facilitation of trunk; For safety; Minimum assistance (09/27/2021  1:00 PM)     Dressing LE Dressing Assist: Maximum Assist (09/27/2021  1:00 PM)    UE Dressing Assist: Stand By Assist (09/27/2021  1:00 PM)         Current Medical Problems/Risks of Medical Complications/Management    ALS(meets criteria for definite-ALS per Neurology)  Tetraparesis, Dysarthria/bulbar weakness  Fasciculations, hyperreflexia  Fall Risk  Impaired mobility and ADL  - Riluzole has been discussed with patient and son wants to hold on medication addition   - PT/OT/SLP to eval and treat  - Outpatient referral for ALS clinic when discharged (scheduled by neuro)  - Neuropsych to eval and treat  ?  HTN  - Home amlodipine 2.5mg  M/W/F   - May consider increasing amlodipine to daily   ?  Hypothyroidism   - Continue PTA synthroid  Free T4 09/22/21 0.8-wnl  ?  Mitral regurgitation.   follows with Dr. Geronimo Boot with [facility]  ?  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 Has appt with oncologist opt.  - Continue to monitor   ?  Severe malnutrition:  Malnutrition Assessment:?  Malnutrition present on admission  ICD-10 code E43: Chronic illness/Severe malnutrition  ??????Weight loss: >?5% x 1 month, Energy intake: 75% or less of estimated energy requirement for  1 month or more ?  Malnutrition Interventions: Small frequent meals/snacks q2-3 hrs, 6 total. Meals BID + 1/2 carton of kate farms 1.4 or 1.0, 4x per day.  Our dietician will continue to follow.  ?  Pain Management: pain is well controlled, heat / ice PRN, topical medication and Tylenol PRN  ?  Skin: There are no pressure sores currently. and Encourage the patient to continue to inspect their skin and perform pressure relief.  ?  Bowel: continent of bowel stool softeners added  ?  Bladder: continent of bladder, Post-void bladder scan x3 until <18ml and Intermittent straight catheterization for >341ml  ?  BMI: 18.55kg/m2  - Consult dietician for concerns of malnutrition.   ?  Mental Health: consult neuropsychology to provide support / counseling    DVT Prophylaxis: enoxaparin (LOVENOX) syringe 40 mg  QDAY(21)    Darliss Cheney, MD         Subjective     Pt seen and examined with Dr. Dairl Ponder. Vitals reviewed and patient is hemodynamically stable with improved BP. Having adequate bowel and bladder function with last BM 5/18. Had trouble sleeping last night and was up early this morning.     Swallow eval ordered yesterday, to be completed today or tomorrow.     Patient's son requesting reduced lab draws. Labs remain stable so will reduce to Tuesday/Friday.     Objective                          Vital Signs: Last Filed                 Vital Signs: 24 Hour Range   BP: 135/73 (05/21 0503)  Temp: 36.6 ?C (97.8 ?F) (05/21 0503)  Pulse: 61 (05/21 0503)  Respirations: 18 PER MINUTE (05/21 0503)  SpO2: 93 % (05/21 0503)  O2 Device: None (Room air) (05/21 0503) BP: (114-143)/(62-89)   Temp:  [36.3 ?C (97.3 ?F)-36.6 ?C (97.8 ?F)]   Pulse:  [61-70]   Respirations:  [18 PER MINUTE]   SpO2:  [93 %-98 %]   O2 Device: None (Room air)     Vitals:    09/26/21 1311   Weight: 49 kg (108 lb 0.4 oz)       Intake/Output Summary:  (Last 24 hours)    Intake/Output Summary (Last 24 hours) at 09/28/2021 0742  Last data filed at 09/28/2021 0600  Gross per 24 hour   Intake 180 ml   Output --   Net 180 ml        Last Bowel Movement Date: 09/25/21    Labs--reviewed  Results for orders placed or performed during the hospital encounter of 09/26/21 (from the past 24 hour(s))   CBC CELLULAR THERAPEUTICS    Collection Time: 09/27/21  8:00 AM   # # Low-High    White Blood Cells 7.4 4.5 - 11.0 K/UL    RBC 4.16 4.0 - 5.0 M/UL    Hemoglobin 12.3 12.0 - 15.0 GM/DL    Hematocrit 16.1 36 - 45 %    MCV 90.3 80 - 100 FL    MCH 29.6 26 - 34 PG    MCHC 32.8 32.0 - 36.0 G/DL    RDW 09.6 11 - 15 %    Platelet Count 219 150 - 400 K/UL    MPV 8.9 7 - 11 FL   BASIC METABOLIC PANEL CELLULAR THERAPEUTICS    Collection Time: 09/27/21  8:00 AM   # #  Low-High    Sodium 144 137 - 147 MMOL/L Potassium 3.9 3.5 - 5.1 MMOL/L    Chloride 109 98 - 110 MMOL/L    CO2 27 21 - 30 MMOL/L    Anion Gap 8 3 - 12    Glucose 101 (H) 70 - 100 MG/DL    Blood Urea Nitrogen 30 (H) 7 - 25 MG/DL    Creatinine 1.61 0.4 - 1.00 MG/DL    Calcium 9.4 8.5 - 09.6 MG/DL    eGFR >04 >54 mL/min       Medications:  Scheduled Meds:amLODIPine (NORVASC) tablet 2.5 mg, 2.5 mg, Oral, QDAY  enoxaparin (LOVENOX) syringe 40 mg, 40 mg, Subcutaneous, QDAY(21)  levothyroxine (SYNTHROID) tablet 25 mcg, 25 mcg, Oral, QDAY(07)  Protandim NAD Synergizer (Patient's Own Medication), 1 Dose, Oral, QDAY  Protandim NRF1 Synergizer (Patient's Own Medication), 1 Dose, Oral, QDAY  Protandim NRF2 Synergizer (Patient's Own Medication), 1 Dose, Oral, QDAY  sodium chloride PF 0.9% flush 5-10 mL, 5-10 mL, Intravenous, FLUSH Daily    Continuous Infusions:  PRN and Respiratory Meds:aluminum/magnesium hydroxide Q4H PRN, other medication PRN, bisacodyL QDAY PRN, ondansetron Q6H PRN       Physical Exam  General: Alert, conversant, NAD  HEENT: NCAT, oral mucosa moist   Heart: Extremities appear well perfused  Lungs: non labored breathing. Symmetrical chest expansion  Abdomen: non-distended  Extremities: no pedal edema  Neuro: Alert, follows commands. Appropriate processing time with conversation.   Psych: pleasant mood, appropriate affect    Labs & Therapy Notes Reviewed    Darliss Cheney, MD

## 2021-09-28 NOTE — Progress Notes
SPEECH-LANGUAGE PATHOLOGY  REHAB SPEECH DAILY NOTE      09/28/21 1100   Behavior   Comments* Pt seated in recliner upon visit; pt's son present. Pt pleasant and participatory in all tasks.   Motor Speech Goals   Therapy Activities Articulation   Articulation Unstructured conversation;Minimal prompting - patient able to 50-74% of the time   Therapy Activity Comments Discussion re: AAC communication modalities. Pt independently self-correcting at times of breakdown though not always effective   Verbal Expression Goals   Therapy Activities Use of an augmentative communication device   Use of an Augmentative Communication Device Moderate prompting - 25-49% of the time   Therapy Activity Comments Session focused on communication options available to patient in the setting of ALS diagnosis. Reviewed ongoing use of verbal expression as primary communication until no longer feasible given disease progression. Tobii Dynavox presented; SLP demonstrated features of device including cost, training, and use of eye gaze. System collaborated to eye gaze. Pt able to generate biographical information on alphabet board given mod-max cues. Additionally reviewed options of voice banking, simple AAC on iPad. All questions answered. Pt interested in all options however seems dependent on financial coverage at this time.       Assessment Pt progressing towards goals   Plan Motor Speech  -Read phrases/sentences for strategy use  -Family to bring in voice amplification device    AAC  -Ongoing education of AAC systems/options  -Voice banking of consistently used phrases  -Trial applications on iPad  -Ongoing introduction to Tobii Dynavox  -Review text to speech applications      Therapist: [address] [place], [place], CCC-SLP Voalte: [id number]  Date: 09/28/2021

## 2021-09-29 ENCOUNTER — Encounter: Admit: 2021-09-29 | Discharge: 2021-09-29 | Payer: MEDICARE | Primary: Family

## 2021-09-29 MED ORDER — ACETAMINOPHEN 325 MG PO TAB
650 mg | ORAL | 0 refills | Status: DC | PRN
Start: 2021-09-29 — End: 2021-10-06

## 2021-09-29 MED ORDER — MELATONIN 1 MG PO TAB
1 mg | Freq: Every evening | ORAL | 0 refills | Status: DC | PRN
Start: 2021-09-29 — End: 2021-10-05

## 2021-09-29 NOTE — Progress Notes
Physical Medicine & Rehabilitation Progress Note       Today's Date:  09/29/2021  Admission Date: 09/26/2021  LOS: 3 days  Insurance: [facility] MEDICARE    Principal Problem:    ALS (amyotrophic lateral sclerosis) (HCC)  Active Problems:    Essential hypertension    Mitral valve insufficiency    Declining functional status    Severe malnutrition (HCC)    Risk for falls    Impaired mobility and activities of daily living    Dysarthria                       Assessment/Plan:     Candace Keith is a 82 y.o.  female admitted to [facility] on 09/26/2021 with the following issues: ALS    Rehabilitation Plan  Tentative discharge date: TBD  Rehabilitation: Patient will continue with comprehensive therapies including physical therapy, occupational therapy, speech & language pathology, specialized rehab nursing, neuropsychology and physiatry oversight.    Goals: at ambulation level, household mobility, Independent  Recommended therapy after discharge: Home with Assistance, and, Home Health Setting  PT recommended equipment: Too early to be determined  OT recommended equipment:      Daily Functional Update:  Transfers        Transfer: Assistive Device: Roller Walker (09/27/2021  1:00 PM)    Transfer: Sit to stand assist level: Minimum assistance; Moderate assistance (Assist level varied between minimal and moderate) (09/27/2021  1:00 PM)    Transfer: Stand pivot assist level: Minimum assistance (09/27/2021  1:00 PM)    No data recorded   Gait/ Mobility        Gait: Assistive Device: Roller Walker (09/27/2021  8:00 AM)    GAIT: Assist Level: Minimum assistance (09/27/2021  8:00 AM)    Gait Distance: 150 feet (150, 50, 20) (09/27/2021  8:00 AM)    No data recorded  No data recorded   Toileting        Toileting Assist: Moderate Assist (09/27/2021  1:00 PM)    No data recorded  Toilet Transfer Assist: Facilitation of trunk; For safety; Minimum assistance (09/27/2021  1:00 PM) Dressing LE Dressing Assist: Maximum Assist (09/27/2021  1:00 PM)    UE Dressing Assist: Stand By Assist (09/27/2021  1:00 PM)         Current Medical Problems/Risks of Medical Complications/Management    ALS  Tetraparesis, Dysarthria/bulbar weakness, Oropharyngeal dysphagia  Fasciculations, hyperreflexia  Fall Risk  Impaired mobility and ADL  - Riluzole has been discussed with patient and son wants to hold on medication addition   - PT/OT/SLP to eval and treat   - regular diet / thin liquids per SLP assessment 5/22  - Outpatient referral for ALS clinic when discharged (scheduled by neuro)  - Neuropsych to eval and treat  ?  Chronic respiratory failure  Neuromuscular restrictive lung disease  Sleep disturbance  - BIPAP at night  - cough assist machine during the day prn, suction prn  - adding melatonin 1mg  QHS prn 5/22  - sleep bundle ordered  - patient will require indefinite use of BIPAP at night, cough assist and suction as needed, due to progressive neuromuscular disease and chronic respiratory failure    HTN  - Home amlodipine 2.5mg  M/W/F, currently taking daily   ?  Hypothyroidism   - Continue PTA synthroid (patient refusing per [facility], will f/u)  - Free T4 09/22/21 0.8-wnl, TSH 13.79 elevated  ?  Mitral  regurgitation   - follows with Dr. Geronimo Boot with [facility]  ?  Left breast mass  - f/u with oncologist outpatient  ?  Malnutrition Assessment  Malnutrition present on admission  ICD-10 code E43: Chronic illness/Severe malnutrition  ??????Weight loss: >?5% x 1 month, Energy intake: 75% or less of estimated energy requirement for 1 month or more ?  Malnutrition Interventions: Small frequent meals/snacks q2-3 hrs, 6 total. Meals BID + 1/2 carton of [employer] farms 1.4 or 1.0, 4x per day.  Our dietician will continue to follow.  ?  Pain Management: pain is well controlled, heat / ice PRN, topical medication and Tylenol PRN  ?  Skin: There are no pressure sores currently. and Encourage the patient to continue to inspect their skin and perform pressure relief.  ?  Bowel: continent of bowel  ?  Bladder: continent of bladder, Post-void bladder scan x3 until <170ml and Intermittent straight catheterization for >376ml  ?  BMI: 18.55kg/m2  - Consult dietician for concerns of malnutrition.   ?  Mental Health: consult neuropsychology to provide support / counseling    DVT Prophylaxis: enoxaparin (LOVENOX) syringe 40 mg  QDAY(21)     Code status:  currently full code, continue to discuss with family    Subjective     No acute overnight events, but reports very poor sleep.  Having difficulties with getting BIPAP placed at night per patient and son.    Objective                          Vital Signs: Last Filed                 Vital Signs: 24 Hour Range   BP: 153/91 (05/22 0440)  Temp: 36.6 ?C (97.9 ?F) (05/22 0440)  Pulse: 71 (05/22 0440)  Respirations: 16 PER MINUTE (05/22 0440)  SpO2: 96 % (05/22 0440)  O2 Percent: 21 % (05/21 2059)  O2 Device: None (Room air) (05/22 0440) BP: (138-153)/(71-91)   Temp:  [36.6 ?C (97.9 ?F)-36.7 ?C (98 ?F)]   Pulse:  [66-71]   Respirations:  [14 PER MINUTE-16 PER MINUTE]   SpO2:  [95 %-96 %]   O2 Percent:  [21 %]   O2 Device: None (Room air)     Vitals:    09/26/21 1311 09/29/21 0440   Weight: 49 kg (108 lb 0.4 oz) 48.7 kg (107 lb 5.8 oz)       Intake/Output Summary:  (Last 24 hours)  No intake or output data in the 24 hours ending 09/29/21 0814     Last Bowel Movement Date: 09/28/21    Labs--reviewed  Results for orders placed or performed during the hospital encounter of 09/26/21 (from the past 24 hour(s))   CBC CELLULAR THERAPEUTICS    Collection Time: 09/28/21  8:36 AM   # # Margarito Liner    White Blood Cells 6.5 4.5 - 11.0 K/UL    RBC 4.42 4.0 - 5.0 M/UL    Hemoglobin 13.2 12.0 - 15.0 GM/DL    Hematocrit 16.1 36 - 45 %    MCV 91.1 80 - 100 FL    MCH 29.9 26 - 34 PG    MCHC 32.8 32.0 - 36.0 G/DL    RDW 09.6 11 - 15 %    Platelet Count 245 150 - 400 K/UL    MPV 9.3 7 - 11 FL   BASIC METABOLIC PANEL CELLULAR THERAPEUTICS    Collection Time:  09/28/21  8:36 AM   # # Low-High    Sodium 145 137 - 147 MMOL/L    Potassium 3.9 3.5 - 5.1 MMOL/L    Chloride 107 98 - 110 MMOL/L    CO2 31 (H) 21 - 30 MMOL/L    Anion Gap 7 3 - 12    Glucose 114 (H) 70 - 100 MG/DL    Blood Urea Nitrogen 29 (H) 7 - 25 MG/DL    Creatinine 1.61 0.4 - 1.00 MG/DL    Calcium 9.5 8.5 - 09.6 MG/DL    eGFR >04 >54 mL/min       Medications:  Scheduled Meds:amLODIPine (NORVASC) tablet 2.5 mg, 2.5 mg, Oral, QDAY  enoxaparin (LOVENOX) syringe 40 mg, 40 mg, Subcutaneous, QDAY(21)  levothyroxine (SYNTHROID) tablet 25 mcg, 25 mcg, Oral, QDAY(07)  Protandim NAD Synergizer (Patient's Own Medication), 1 Dose, Oral, QDAY  Protandim NRF1 Synergizer (Patient's Own Medication), 1 Dose, Oral, QDAY  Protandim NRF2 Synergizer (Patient's Own Medication), 1 Dose, Oral, QDAY  sodium chloride PF 0.9% flush 5-10 mL, 5-10 mL, Intravenous, FLUSH Daily    Continuous Infusions:  PRN and Respiratory Meds:aluminum/magnesium hydroxide Q4H PRN, other medication PRN, bisacodyL QDAY PRN, ondansetron Q6H PRN       Physical Exam  General: Alert, conversant, NAD  HEENT: NCAT, oral mucosa moist   Heart: Extremities appear well perfused  Lungs: non labored breathing. Symmetrical chest expansion  Abdomen: non-distended  Extremities: no pedal edema  Neuro: Alert, follows commands. Appropriate processing time with conversation.   Psych: pleasant mood, appropriate affect    Labs & Therapy Notes Reviewed    Vinicius Jenetta Loges, MD

## 2021-09-29 NOTE — Progress Notes
PHYSICAL THERAPY       09/29/21 0800   Type of Note   Type of Note Daily   Time Calculation   Start Time 0800   Stop Time 0900   Time Calculation 60   $$ Professional Contact 1 unit   (coordination of care)   $$ Gait / Mobility 2 units    $$ PT Therapeutic Activity 2 units   Subjective   Subjective Patient sitting up in bed upon PT arrival. Patient's daughter, son, and granddaughter present. Patient agreeable to participate in physical therapy.   Pain   Pain Scale No Pain   Cognitive   Patient Behavior Calm;Cooperative   Home Environment   Comment Patient states there is the possibility for a ramp at the back entrance and that they did this for patient's mother.   Bed Mobility   Bed Mobility: Supine to sit assist level Minimum assistance   Bed Mobility: Supine to sit type of assist Assist with trunk;With head of bed elevated   Transfers   Transfer: Assistive Device Nurse, adult   Transfer: Sit to stand assist level Minimum assistance   Transfer: Sit to stand type of assist Facilitation of trunk;For safety   Transfer: Stand to sit assist level Minimum assistance   Transfer: Stand to sit type of assist Facilitation of trunk;For safety   Transfer: Stand pivot assist level Minimum assistance   Transfer: Stand pivot type of assist For safety   Toileting   Toileting-Adjusting clothing BEFORE using toiet, commode, bedpan or urinal Assist Yes   Toileting-Wipe Self Assist Yes   Toileting-Adjust clothing AFTER using toilet, commode, bedpan or urinal Assist Yes   Toileting Assist Moderate Assist   Toileting Position Seated   Toileting Comments Incontinence noted on brief.   Retail buyer Grab bar - left   Daily Care   Patient continent of bladder? No   Urine Occurrence 2   Urine Function Void;Diaper/brief/incontinence pad;Incontinence   Urine Color Unable to assess or not observed Urine Description Unable to assess or not observed   Patient continent of bowel? Yes, no bowel program   Stool Occurrence 1   Stool Amount Small   Stool Appearance Formed   Stool Color Brown   Last Bowel Movement Date 09/29/21   Gait   Gait Distance 150 feet  (x2)   GAIT: Assist Level Minimum assistance   GAIT: Type of Assist For safety;Facilitation of trunk   Gait: Hospital doctor   Gait: Patterns Decreased gait velocity;Shuffling   Gait: Deviation Left Decreased stride length   Gait: Deviation Right Decreased stride length   Activity Limited By Complaint of fatigue   Activity   PT Therapeutic Activities Toileting activity. Patient brushed teeth standing at sink. Lengthy discussion with patient and daughter regarding rehab plan, logistics of rehab stay, and goals of rehab stay.   Outcome Measures   Timed Up and Go Assessed   Timed Up and Go: Standard (seconds) 71.95  (average of 3 trials)   Timed Up and Go: Assistance Used Minimum assistance   Timed Up and Go: Assistive Device used [employer]   Timed Up and Go Fall Risk  >13.5 seconds indicates increased risk for falls   Assessment   Assessment Patient tolerated session well. Patient's daughter asked appropriate questions and was receptive to education.   Plan   Comments family training; figure  out plan for stairs/ramp; determine equipment needs (wheelchair evaluation on 5/23); functional activities; low-moderate intensity exercise (monitor RPE); ALS education       [name], DPT, NCS  Doctor of Physical Therapy, Board Certified Clinical Specialist in Neurologic Physical Therapy

## 2021-09-29 NOTE — Rehab Care Plan
Physical Medicine & Rehabilitation Individualized Overall Plan of Care    Date of Service:  09/29/2021   Name: Candace Keith   MRN: [id number]     DOB: 03/26/1941      Age: 81 y.o.  Admission Date: 09/26/2021     LOS: 3 days     Date of Service: 09/29/2021      Insurance:  Medicare Repl  Date of Admission:  09/26/2021    Hospital Course: [name] is a 81 year old female with a past medical history of hypertension and mitral valve regurgitation/insufficiency. Patient was admitted on 09/22/2021 with chief complaint of generalized weakness, debility, failure to thrive, speech changes, and frequent falls. ?Patient was brought to the [facility] by her son. Neurology was consulted. ?No evidence of stroke. 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   ?  Patient has been working with physical, speech and occupational therapy since rehab admission and has been making functional gains.       Rehab Diagnosis: ALS   Pain and Weakness    Medical Course since IRF Admission:    The patient?s clinical course since admission has been stable.  The patient has been able to participate fully in the rehabilitation program due to relatively stable neurologic condition for now, and we have addressed in the following ways: working on sleep management and insomnia, working on DME for anticipated continued functional decline, BIPAP at night and cough assist prn during the day, assessing safety of swallow.    Medical Prognosis: Poor    The patient has a reasonable likelihood of fully participating in and completing the IRF stay based on ability to manage medical issues including ALS which is expected to gradually continue to worsen with expected functional decline.  While there is significant concern for a shortened lifespan, this IRF stay is necessary for any chance for them to maintain function for as long as possible, get all DME needed to functional safely in the home, and discharge safely to community with family for a high quality of life despite progressive neuromuscular disease.    This patient meets medical necessity requiring the intensity of therapy available at the [facility]. The patient can participate in and can fully benefit from the services offered in the IRF setting including 24 hour rehabilitation nursing, daily oversight from the Physiatrist, and complex interdisciplinary rehab as noted below.    Quality Indicators:  Current Level of Function  Evaluation QI Current QI   Transfer: Sit to stand assist level: Moderate assistance, Minimum assistance (moderate assist from bed)  Gait Distance: 150 feet (150, 50, 20)  GAIT: Assist Level: Minimum assistance  Gait: Assistive Device: Psychologist, prison and probation services Assist: Minimal Assist     UE Dressing Assist: Stand By Assist  LE Dressing Assist: Maximum Assist  Toileting Assist: Moderate Assist  Toilet Transfer Assist: Facilitation of trunk, For safety, Minimum assistance     Patient continent of bladder?: Yes, timed void  Patient continent of bowel?: Yes, no bowel program                Transfer: Sit to stand assist level: Minimum assistance, Moderate assistance (Assist level varied between minimal and moderate)  Gait Distance: 150 feet (150, 50, 20)  GAIT: Assist  Level: Minimum assistance  Gait: Assistive Device: Psychologist, prison and probation services Assist: Minimal Assist     UE Dressing Assist: Stand By Assist  LE Dressing Assist: Maximum Assist  Toileting Assist: Moderate Assist  Toilet Transfer Assist: Facilitation of trunk, For safety, Minimum assistance     Patient continent of bladder?: No  Patient continent of bowel?: Yes, no bowel program                    PT recommended equipment: Too early to be determined   OT recommended equipment: Physical Therapy Goals  Patient will perform: at ambulation level, household mobility, Independent  Occupational Therapy Goals  Pt will perform basic care and transfer with: Minimum assistance  Speech Therapy Goals  Patient Will Improve Speech Intelligibility for Communication with: Minimum assist  Patient Will Express Basic Wants and Needs for Daily Living Skills with: Minimum assist  Nursing Goals  Supervision, son main support    Additional therapeutic disciplines may be included during this stay if indicated during interdisciplinary communication and will be noted in the daily progress notes when relevant.  Social Work will address discharge planning needs.    Rehabilitation Plan    Patient will receive Physical therapy, Occupational therapy and Speech therapy each 60 minutes a day, 5 days a week for a total of 3 hours daily (minimum) for the duration of the rehabilitation stay within an interdisciplinary rehabilitation program with case manager/social worker, dietician, neuropsychologist, rehab nursing and PM&R oversight.    Rehabilitation Prognosis: Good, anticipate steady functional gains with PT, OT, and SLP to return home with family support.  Tolerance for three hours of therapy a day: Good, patient is tolerating 3 hours of therapy per day, as required.    Goals/Barriers/Facilitators  Family / Patient Goals: Patient would like to regain strength and endurance to safely return home.   Mobility Goals: Updated per therapy evaluation as above  Activities of Daily Living (ADLs) Goals: Updated per therapy evaluation as above.  Cognition / Communication Goals: Updated per therapy evaluation as above.    Discharge Planning  Expected Length of Stay 1 week(s)  Expected Discharge Disposition Home  Expected Discharge Needs PT, OT, SLP, Nursing, Commode, Wheelchair, Walker and BIPAP, cough assist and suction machines    The patient should reach their current goals by noted projected discharge date.    This plan of care was formulated based upon a review of the progress this patient made with therapies during the acute hospital stay, the goals set by the inpatient rehabilitation therapists at the time of their initial assessment, and my expertise in caring for patients with this rehabilitation diagnosis and accompanying comorbidities.    Harland German, MD  09/29/21 12:04 PM

## 2021-09-29 NOTE — Progress Notes
SPEECH-LANGUAGE PATHOLOGY  DAILY NOTE     09/29/21 0930   Behavior   Comments* Pt seated in recliner, finishing PT session when SLP arrived. Pt's family present and engaged throughout session as well. All needs met at end of session.   Motor Speech Goals   Therapy Activities Articulation   Articulation Unstructured conversation;Moderate prompting - patient able to 25-49% of the time   Therapy Activity Comments Ongoing discussion and education re: ACC modalities in the setting of ALS diagnosis. Discussed free apps to trial on pt's phone (i.e. speech to text, etc) and briefly discussed consideration of AAC device when warranted. Noted that there are many different factors to consider when determing which AAC device/program will be most effective. Provided education re: voice banking and creating list of important phrases pt may want to include. Pt's family member noted he brought a voice amplification device to the unit for pt to trial; however, stated this was minimally effective and not functional for pt. Did not trial during session this date due to time constraints. This clinician showed pt a free app Vocable AAC that has speech to text, tap to speak (phrases), and eye gaze options. Unable to locate this specific app in pt's smartphone this date. Will continue to review apps that are available on pt's phone and determine appropriateness.     Pt's speech judged to be <50% intelligible during structured face to face conversation with known context this date. Pt with good efforts to utilize speech strategies; however, these were not always effective.     Pt's family with a variety of questions related to swallow function as well this session. Provided general education and discussed specific questions. Notified family of plan for bedside swallow evaluation later this date.      Assessment Pt is progressing towards goals.    Plan Motor Speech  -Read phrases/sentences for strategy use  -Family to bring in voice amplification device  ?  AAC  -Ongoing education of AAC systems/options  -Voice banking of consistently used phrases  -Trial applications on iPad  -Trial applications on pt's smartphone as able (play store is name of pt's app store)  -Ongoing introduction to [employer]  -Review text to speech applications      Fallyn Munnerlyn MS, CCC-SLP  Voalte [phone]

## 2021-09-29 NOTE — Progress Notes
Chaplain Note:    Admit Date: 09/26/2021         Chaplain met with patient and her son Collier Salina per Chaplaincy Consultation and to clarify Catholic needs.    Pt was fixing her freshly washed hair so I visited with Collier Salina.  Pt and Collier Salina would like our Catholic priest to visit patient on Tuesday.  I will inform Fr. Dominic of this request.  Collier Salina and I talked about various Catholic retreat centers we have visited.    Pt has two sons and three daughters for support.  I offered a prayer blessing for a good day to pt and her son.  Chaplaincy will continue to remain available for spiritual and emotional support.    The spiritual care team is available as needed, 24/7, through the campus switchboard [phone]). For a response within 24 hours, please submit an order in O2 for a chaplain consult.       Date/Time:                      User:                                      09/29/2021 12:38 PM Helene Shoe, M.Div, [facility]      PCU 3 PCU

## 2021-09-29 NOTE — Progress Notes
OCCUPATIONAL THERAPY       09/29/21 1100   Time Calculation   Start Time 1100   Stop Time 1200   Time Calculation 60   $$ Professional Contact 1 unit   $$ Phys ADL Skills 4 units   Subjective   Subjective Patient sitting in recliner and agreeable to OT.   Grooming   Grooming - Wash Face Assist No   Grooming - Comb/Brush Hair Assist No   Grooming Assist Minimal Assist   Grooming Position Standing for ___ % of task   Grooming Comments Pt stood at sink x10 minutes to brush hair, dry and place hair clips. Minimal cueing for motor planning closing hair clips   Bathing   Bath/Shower Soap and water shower/bath   Bathing - Chest Assist No   Bathing - Left Arm Assist No   Bathing - Right Arm Assist No   Bathing - Abdomen Assist No   Bathing - Perineal Area Assist No   Bathing - Buttocks Assist Yes   Bathing - Left Upper Leg Assist No   Bathing - Right Upper Leg Assist No   Bathing - Left Lower Leg Including Foot Assist No   Bathing - Right Lower Leg Including Foot Assist No   Bathing Assist Minimal Assist   Bathing Position Shower - sitting   Bathing Equipment Shower chair with back;Hand held shower;Washcloth   Bathing Comments setup assist and cueing for safety   Upper Body Dressing   UE Dressing Assist Stand By Assist   Upper Dressing Position Sitting in chair   Upper Dressing Comments setup assist   Lower Body Dressing   LE Dressing Assist Moderate Assist   Lower Dressing Position Sitting in chair   Lower Dressing Comments able to thread BLE into pants and pull over hips with increased time, assist for socks   Toileting   Toileting-Adjusting clothing BEFORE using toiet, commode, bedpan or urinal Assist No   Toileting-Wipe Self Assist No   Toileting-Adjust clothing AFTER using toilet, commode, bedpan or urinal Assist No   Toileting Assist Minimal Assist   Toileting Position Seated   Toileting Comments steadying assist for pants management   Toileting Transfer   Toilet Transfer Technique Posterior transfer onto receptacle Toilet Transfer Assist Minimum assistance   Tub/Shower Transfer   Tub/Shower Transfer Technique Posterior transfer onto receptacle   Shower Transfer Assist Minimum assistance   Transfers   Transfer: Assistive Device Roller Walker   Transfer: Sit to stand assist level Minimum assistance   Transfer: Sit to stand type of assist Facilitation of trunk;For safety   Transfer: Stand to sit assist level Minimum assistance   Transfer: Stand to sit type of assist Facilitation of trunk;For safety   Transfer: Stand pivot assist level Minimum assistance   Transfer: Stand pivot type of assist For safety   Transfer Comment Patient ambulated in room with RW and minimal assist.   Assessment   Assessment Pt progressing toward goals.   Plan   Plan Comments assess grip strength, 9 hole peg, education on safety for home ADLs   Type of Note   Type of Note Daily            Nicola Girt, OTR/L

## 2021-09-29 NOTE — Care Plan
Problem: Nutrition Deficit  Goal: Adequate nutritional intake  Flowsheets (Taken 09/29/2021 1423)  Adequate nutritional intake: Assess nutritional status     Followed up with pt and son at bedside. Pt working on lunch of salad and [employer] 1.4 vanilla today. Per meal documentation, pt has been consuming meals in addition to [employer] BID. Of note pt likes drinking Molli Posey at room temperature. Pt alternating between 1.0 Chocolate and 1.4 Vanilla since 1.4 Chocolate is unavailable. Pt has 1 pouch of Compleat Organic Blends at bedside and son asking about whether pt can use this. Son prefers this to [employer] due to carbohydrate profile. Discussed this can be used in addition to [facility]; will have more stocked on unit. Pt had requested olive oil for salad, but was provided a different type of oil. Confirmed with dietary staff that olive oil is not something the kitchen is able to provide and relayed to pt and son. Pt and son deny further questions.     Recommendation:  Continue current diet order: Regular   Small frequent meal attempts.   Order meals BID + 1/2 cartons of [employer] 1.0 or 1.4 QID.   Can also use Compleat Oraganic Blends pouch as an alternative protein supplement.   Trend wt weekly.   The nutrition-related order modifications are made in communication with the primary service, who remains responsible for the orders and overall care of the patient.     Silvio Clayman, MS, RDN, LD  Clinical Dietitian - Dept of Clinical Nutrition   Available on Voalte

## 2021-09-29 NOTE — Progress Notes
SPEECH-LANGUAGE PATHOLOGY  CLINICAL SWALLOW ASSESSMENT     Name: Candace Keith   MRN: [id number]     DOB: 1940-09-12      Age: 81 y.o.  Admission Date: 09/26/2021     LOS: 3 days     Date of Service: 09/29/2021    Evaluation Summary   Clinical swallow evaluation completed.   Clinical Impression: Mild dysphagia  Sources of Dysphagia: Weakness from neurological diagnosis  Anticipated Improvement: Progressive decline of swallowing in conjunction with ALS    Swallow Recommendations  PO: Regular, Thin liquids (with additional sauces/gravies; defer to softer textures for comfort)  Medications: As tolerated  Supervision: No supervision needed  Positioning: Upright 90 degrees or chair mode  Swallow Strategies: Small bites/sips, Slow rate of intake, Alternate liquids/solids  Oral Hygiene: 3 times per day, Complete oral care to minimize the risk of aspirating oral bacteria   Will peripherally monitor swallow function while on IPR unit however pt currently functional with least restrictive diet in the setting of progressive neuromuscular dx    PO Presentation  Presentations: Patient Fed Self  Thin Liquid: Cup  Other Consistencies: Puree, Regular solids    Clinical Interpretation of Oral Stage  Withdraw Bolus: WFL  Form Bolus: Slowed  Masticate Bolus: Prolonged mastication  Transfer Bolus: WFL  Anterior Bolus Spillage: None  Oral Residue: None    Clinical Interpretation of Pharyngeal Stage  Swallow Initiation: Delayed  Laryngeal Elevation: Suspected to be reduced  Signs / Symptoms Of Aspiration: Puree, Regular solid  Puree: Multiple swallows  Regular Solid: Multiple swallows  Suspected Pharyngeal Stage Impairment: Decreased laryngeal vestibule closure, Incomplete pharyngeal clearance  Improvement Observed With: Swallow strategies  Pharyngeal Stage Summary: Report of globus sensation with regular solids    Oral Mech Exam  Oral Mech WFL: No  Lips: Impaired Strength - Bilateral  Tongue: Impaired ROM - Bilateral, Impaired Strength - Bilateral (Fasciculations noted)  Buccal: Impaired Strength - Bilateral  Jaw: WFL  Velopharynx: Impaired ROM - Bilateral  Visceral Swallow: Laryngeal Elevation Reduced  Vocal Quality: Reduced Loudness  Volitional Cough: Weak  Dentition: Natural - Good    Attempted Swallow Strategies  Small Bites/Sips: Effective  Slow Rate of Intake: Effective  Alternate Solids/Liquids: Effective    Objective  Relevant Med Background: Pt is a 81 y.o.? female?ad[facility] to [facility] on 09/26/2021?with the following issues: debility due to ALS  Pertinent Dysphagia History: Report of coughing with liquids over the last 6 months. Pt additionally reports globus sensation with foods.  Lives With: Sher[facility] Spine Help From: Family  Psychosocial Status: Willing and Cooperative to Participate  Persons Present: Son, Daughter    CT Head 5/15  IMPRESSION   1. ?No acute intracranial hemorrhage or mass effect.   2. ?Mild generalized cerebral and cerebellar atrophy. Small chronic   appearing bilateral thalamic, right lentiform nucleus, and right corona   radiata lacunar infarcts and moderate patchy supratentorial white matter   disease, likely reflecting chronic microvascular ischemic changes.     Subjective  Pain: Patient has no complaint of pain  Pain Level Current: No pain  Trach Presence: No  Feeding Tube Present During Eval: None    Nutrition  Nutrition Prior To Hospitalization: Oral, Regular, Thin Liquids  Current Form Of Nutrition: Oral, Regular, Thin Liquids    Education  Persons Educated: Pt/Family  Barriers To Learning: None Noted  Interventions: Family Educated, Repetition of Instructions  Teaching Methods: Verbal  Topics: Dysphagia (ALS)  Patient  Response: Verbalized Understanding  Goal Formulation: With Pt/Family    Assessment/Prognosis  Prognosis: Poor  NOMS Dysphagia Rating: 5-Mild Dysphagia -Swallow safe w/ min diet restriction &/or occasionally requires min cues to use compensatory strategies. May occasionally self-cue. All nutrition/hydration needs met by mouth at mealtime.    Speech Discharge Recommendations  Recommendation: Home/prior living situation    Therapist:Lyzbeth Genrich [place], [place], CCC-SLP Voalte: [phone]  Date:09/29/2021

## 2021-09-30 NOTE — Consults
Neuromuscular Consult      Admission Date: 09/26/2021                                                LOS: 4 days    Reason for Consultation:    Assessment/Plan    Candace Keith is a 81 y.o. female with a new diagnosis of limb onset ALS. Onset April 2022 with left leg weakness. She now has significant bulbar symptoms and is nearly anarthric with respiratory insufficiency and facial weakness. Legs are also weak L>R. Reflexes are brisk throughout with diffuse fasciculations. EMG active denervation in the right cervical and lumbosacral regions, along with fasciculations in these regions and the thoracic paraspinals. Presentation is consistent with a diagnosis of ALS.    Will defer starting treatments until seen in clinic  Next available appointment in ALS clinic was requested      Patient seen and discussed with Dr. Carleene Mains, DO  Clinical Neurophysiology Fellow    ===========================================================================  History of Present Illness: Candace Keith is a 81 y.o. female with HTN and mitral valve regurgiation who was admitted on 5/15 for progressive weakness and speech changes followed by a new diagnosis of ALS.    Symptoms began in April 2022 with left leg weakness. 1-2 months later she began experiencing speech changes. She was evaluated for stroke and hand an MRI brain that was normal. She continued to progress with frequent falls. Eventually she was referred to neurology, but patient presented to [facility] emergency department prior to the appointment.    UEXT:  Mild hand weakness bilateral.     LEXT: Bilateral leg weakness, L>R. She falls often with one head injury prior to admission.     Cramps: None     Speech/swallow: Speech is barely intelligible. She has some difficulty with swallowing. She was seen by speech and cleared for regular diet.     PBA:  None     Breathing:   Started on BiPAP at night during admission. VC was 700 and NIF -19. Denies orthopnea.     Meds/equipment: BiPAP machine            Medical History:   Diagnosis Date   ? Essential hypertension 01/14/2017   ? Mitral valve insufficiency 01/14/2017     No past surgical history on file.  Social History     Tobacco Use   ? Smoking status: Never   ? Smokeless tobacco: Never   Substance Use Topics   ? Alcohol use: No   ? Drug use: No     Family History   Problem Relation Age of Onset   ? Heart Disease Mother    ? Fever Syndrome Mother         Rheumatic   ? Heart Surgery Mother 58        Aortic valve replacement   ? Heart Surgery Child 23        Repair of hole in heart     Allergies:  Patient has no known allergies.    Scheduled Meds:amLODIPine (NORVASC) tablet 2.5 mg, 2.5 mg, Oral, QDAY  enoxaparin (LOVENOX) syringe 40 mg, 40 mg, Subcutaneous, QDAY(21)  Protandim NAD Synergizer (Patient's Own Medication), 1 Dose, Oral, QDAY  Protandim NRF1 Synergizer (Patient's Own Medication), 1 Dose, Oral, QDAY  Protandim NRF2 Synergizer (Patient's Own Medication),  1 Dose, Oral, QDAY      Continuous Infusions:  PRN and Respiratory Meds:acetaminophen Q4H PRN, aluminum/magnesium hydroxide Q4H PRN, other medication PRN, bisacodyL QDAY PRN, melatonin QHS PRN, ondansetron Q6H PRN    Review of Systems:  See HPI    ================================================================\  Exam:  Vital Signs:  Last Filed in 24 hours Vital Signs:  24 hour Range    BP: 137/78 (05/23 0621)  Temp: 36.6 ?C (97.8 ?F) (05/23 4696)  Pulse: 62 (05/23 0621)  Respirations: 16 PER MINUTE (05/23 1245)  SpO2: 97 % (05/23 0621)  O2 Percent: 21 % (05/22 1541)  O2 Device: None (Room air) (05/23 0621) BP: (130-145)/(78-93)   Temp:  [36.6 ?C (97.8 ?F)-36.8 ?C (98.3 ?F)]   Pulse:  [61-67]   Respirations:  [16 PER MINUTE-18 PER MINUTE]   SpO2:  [95 %-98 %]   O2 Percent:  [21 %]   O2 Device: None (Room air)     General: No apparent distress  RS: On room air, comfortable  CV: No pedal edema  GI: Non-distended abdomen      Neurological examination: Mental Status: Alert, oriented to time, place and person   CN II thru XII: Pupils 3mm, reactive to light and accommodation, intact extraocular movements, intact facial sensation,  intact hearing, symmetric palatal elevation, able to shrug shoulders equally on both sides, tongue midline.  Tongue fasciculations with atrophy     Palpebral Fissure 10/10   Orb Oculi 3   Orb Oris 3   Tongue  2      Speech: No Dysarthria    Motor Exam:     Neck flexors: 4   Neck extensors: 5         Right Left   Right Left   Shoulder abductors:  4 4 Hip flexion: 4 3   Elbow flexors: 5 5 Hip abduction:     Elbow extensors: 5 5 Hip Adduction:     Wrist extensors: 5 5 Knee extension: 5 5   Wrist flexors: 5 5 Knee flexion: 4 4   Finger flexors: 5 5 Ankle dorsiflexion: 2 2   Finger extensors: 5 5 Ankle plantar flexion: 4 3   Finger abductors: 5 5 Ankle eversion:     Thumb abductors: 5 5 Ankle inversion:           Toe extension: 2 2         Toe flexion:        Fasciculations seen in all extremities    Sensory:   Light touch: intact    Reflexes:   Reflex   Right   Left     Brachioradialis    3+ 3+   Biceps   3+ 3+   Triceps 2+ 2+   Knee 3+ 3+   Ankle   2+ 2+   Jaw Jerk 3+        Right   Left     Plantar response Upgoing with triple flexion Upgoing   Hoffman Present Present      Gait and Station: Not tested

## 2021-09-30 NOTE — Case Management (ED)
Case Management Progress Note    NAME:Candace Keith                          MRN: [id number]              DOB:May 14, 1940          AGE: 81 y.o.  ADMISSION DATE: 09/26/2021             DAYS ADMITTED: LOS: 4 days      Today's Date: 09/30/2021    PLAN:     Discharge planning to home with support from son.    [employer] reviewing referral and orders for Bi-PAP, Cough Assist and suctioning. Awaiting response.    Expected Discharge Date: 10/07/2021   Is Patient Medically Stable: No, Please explain: Rehab goals  Are there Barriers to Discharge? no    INTERVENTION/DISPOSITION:   Discharge Planning                  SW sent referral and orders for Bi-PAP, Cough Assist and suctioning to [employer].     Transportation                  Support                  Info or Referral                  Positive SDOH Domains and Potential Barriers                    Medication Needs                                                                                                                                                          Editor, commissioning                  Other                  Discharge Disposition  Selected Continued Care - Admitted Since 09/26/2021    No services have been selected for the patient.           Julious Oka, LMSW   Available on voalte  [phone]

## 2021-09-30 NOTE — Progress Notes
Physical Medicine & Rehabilitation Progress Note       Today's Date:  09/30/2021  Admission Date: 09/26/2021  LOS: 4 days  Insurance: [facility] MEDICARE    Principal Problem:    ALS (amyotrophic lateral sclerosis) (HCC)  Active Problems:    Essential hypertension    Mitral valve insufficiency    Declining functional status    Severe malnutrition (HCC)    Risk for falls    Impaired mobility and activities of daily living    Dysarthria                       Assessment/Plan:     Candace Keith is a 81 y.o.  female admitted to [facility] on 09/26/2021 with the following issues: ALS    Rehabilitation Plan  Tentative discharge date: 10/07/2021  Rehabilitation: Patient will continue with comprehensive therapies including physical therapy, occupational therapy, speech & language pathology, specialized rehab nursing, neuropsychology and physiatry oversight.    Goals: at ambulation level, household mobility, Independent  Recommended therapy after discharge: Home with Assistance, and, Home Health Setting  PT recommended equipment: Too early to be determined  OT recommended equipment:      Daily Functional Update:  Transfers        Transfer: Assistive Device: Roller Walker (09/29/2021 11:00 AM)    Transfer: Sit to stand assist level: Minimum assistance (09/29/2021 11:00 AM)    Transfer: Stand pivot assist level: Minimum assistance (09/29/2021 11:00 AM)    No data recorded   Gait/ Mobility        Gait: Assistive Device: Roller Walker (09/29/2021  8:00 AM)    GAIT: Assist Level: Minimum assistance (09/29/2021  8:00 AM)    Gait Distance: 150 feet (x2) (09/29/2021  8:00 AM)    No data recorded  No data recorded   Toileting        Toileting Assist: Minimal Assist (09/29/2021 11:00 AM)    No data recorded  Toilet Transfer Assist: Minimum assistance (09/29/2021 11:00 AM)     Dressing LE Dressing Assist: Moderate Assist (09/29/2021 11:00 AM)    UE Dressing Assist: Stand By Assist (09/29/2021 11:00 AM)         Current Medical Problems/Risks of Medical Complications/Management    ALS  Tetraparesis, Dysarthria/bulbar weakness, Oropharyngeal dysphagia  Fasciculations, hyperreflexia  Fall Risk  Impaired mobility and ADL  - Riluzole has been discussed with patient and son wants to hold on medication addition   - PT/OT/SLP to eval and treat   - regular diet / thin liquids per SLP assessment 5/22  - Outpatient referral for ALS clinic when discharged (requested by neuro)   - will consult Neuromuscular Neurology while in rehab for ongoing discussions about disease, prognosis, and transition of care to outpatient ALS clinic  - Neuropsych to eval and treat  ?  Chronic respiratory failure  Neuromuscular restrictive lung disease  Sleep disturbance  - BIPAP at night  - cough assist machine during the day prn, suction prn  - adding melatonin 1mg  QHS prn 5/22  - sleep bundle ordered  - patient will require indefinite use of BIPAP at night, cough assist and suction as needed, due to progressive neuromuscular disease and chronic respiratory failure    HTN  - Home amlodipine 2.5mg  M/W/F, currently taking daily   ?  Hypothyroidism   - Continue PTA synthroid (patient refusing per [facility], will d/c per patient request)  - Free T4 09/22/21  0.8-wnl, TSH 13.79 elevated  ?  Mitral regurgitation   - follows with Dr. Geronimo Boot with [place] [facility]  ?  Left breast mass  - f/u with oncologist outpatient  ?  Malnutrition Assessment  Malnutrition present on admission  ICD-10 code E43: Chronic illness/Severe malnutrition  ??????Weight loss: >?5% x 1 month, Energy intake: 75% or less of estimated energy requirement for 1 month or more ?  Malnutrition Interventions: Small frequent meals/snacks q2-3 hrs, 6 total. Meals BID + 1/2 carton of [employer] 1.4 or 1.0, 4x per day.  Our dietician will continue to follow.  ?  Pain Management: pain is well controlled, heat / ice PRN, topical medication and Tylenol PRN  ?  Skin: There are no pressure sores currently. and Encourage the patient to continue to inspect their skin and perform pressure relief.  ?  Bowel: continent of bowel  ?  Bladder: continent of bladder, Post-void bladder scan x3 until <146ml and Intermittent straight catheterization for >356ml  ?  BMI: 18.55kg/m2  - Consult dietician for concerns of malnutrition.   ?  Mental Health: consult neuropsychology to provide support / counseling    DVT Prophylaxis: enoxaparin (LOVENOX) syringe 40 mg  QDAY(21)     Code status:  currently full code, continue to discuss with family    Subjective     No acute overnight events.  Tolerate overnight oximetry needed for BIPAP at home.  She requests to stop the synthroid, which she says she wasn't taking at home and has been refusing here.  D/w patient code status and she confirmed DNR-FI which was the code status previously.  DME and wheelchair options d/w PT and wheelchair vendor present.    Objective                          Vital Signs: Last Filed                 Vital Signs: 24 Hour Range   BP: 137/78 (05/23 0621)  Temp: 36.6 ?C (97.8 ?F) (05/23 5409)  Pulse: 62 (05/23 0621)  Respirations: 16 PER MINUTE (05/23 0621)  SpO2: 97 % (05/23 0621)  O2 Percent: 21 % (05/22 1541)  O2 Device: None (Room air) (05/23 0621) BP: (130-145)/(78-93)   Temp:  [36.6 ?C (97.8 ?F)-36.8 ?C (98.3 ?F)]   Pulse:  [61-67]   Respirations:  [16 PER MINUTE-18 PER MINUTE]   SpO2:  [95 %-98 %]   O2 Percent:  [21 %]   O2 Device: None (Room air)     Vitals:    09/26/21 1311 09/29/21 0440   Weight: 49 kg (108 lb 0.4 oz) 48.7 kg (107 lb 5.8 oz)       Intake/Output Summary:  (Last 24 hours)  No intake or output data in the 24 hours ending 09/30/21 1058     Last Bowel Movement Date: 09/29/21    Labs--reviewed  Results for orders placed or performed during the hospital encounter of 09/26/21 (from the past 24 hour(s))   BLOOD GASES, ARTERIAL    Collection Time: 09/29/21  3:41 PM   # # Low-High    pH-Arterial 7.42 7.35 - 7.45    pCO2-Arterial 39 35 - 45 MMHG    pO2-Arterial 87 80 - 100 MMHG    Base Excess-Arterial 0.5 MMOL/L    O2 Sat-Arterial 96.6 95 - 99 %    Bicarbonate-ART-Cal 24.8 21 - 28 MMOL/L   BASIC METABOLIC PANEL CELLULAR THERAPEUTICS  Collection Time: 09/30/21  6:58 AM   # # Low-High    Sodium 141 137 - 147 MMOL/L    Potassium 3.8 3.5 - 5.1 MMOL/L    Chloride 106 98 - 110 MMOL/L    CO2 29 21 - 30 MMOL/L    Anion Gap 6 3 - 12    Glucose 92 70 - 100 MG/DL    Blood Urea Nitrogen 33 (H) 7 - 25 MG/DL    Creatinine 1.61 0.4 - 1.00 MG/DL    Calcium 9.0 8.5 - 09.6 MG/DL    eGFR >04 >54 mL/min   CBC CELLULAR THERAPEUTICS    Collection Time: 09/30/21  6:58 AM   # # Margarito Liner    White Blood Cells 5.1 4.5 - 11.0 K/UL    RBC 4.13 4.0 - 5.0 M/UL    Hemoglobin 12.6 12.0 - 15.0 GM/DL    Hematocrit 09.8 36 - 45 %    MCV 90.2 80 - 100 FL    MCH 30.5 26 - 34 PG    MCHC 33.8 32.0 - 36.0 G/DL    RDW 11.9 11 - 15 %    Platelet Count 229 150 - 400 K/UL    MPV 8.8 7 - 11 FL       Medications:  Scheduled Meds:amLODIPine (NORVASC) tablet 2.5 mg, 2.5 mg, Oral, QDAY  enoxaparin (LOVENOX) syringe 40 mg, 40 mg, Subcutaneous, QDAY(21)  Protandim NAD Synergizer (Patient's Own Medication), 1 Dose, Oral, QDAY  Protandim NRF1 Synergizer (Patient's Own Medication), 1 Dose, Oral, QDAY  Protandim NRF2 Synergizer (Patient's Own Medication), 1 Dose, Oral, QDAY    Continuous Infusions:  PRN and Respiratory Meds:acetaminophen Q4H PRN, aluminum/magnesium hydroxide Q4H PRN, other medication PRN, bisacodyL QDAY PRN, melatonin QHS PRN, ondansetron Q6H PRN       Physical Exam  General: Alert, conversant, NAD  HEENT: NCAT, oral mucosa moist   Heart: Extremities appear well perfused  Lungs: non labored breathing. Symmetrical chest expansion  Abdomen: non-distended  Extremities: no pedal edema  Neuro: Alert, follows commands. Appropriate processing time with conversation. Moves all limbs spontaneously.  Psych: pleasant mood, appropriate affect    Labs & Therapy Notes Reviewed    Harland German, MD

## 2021-09-30 NOTE — Progress Notes
Overnight Oximetry Note    Name: Candace Keith   MRN: [id number]     DOB: 01/08/1941      Age: 81 y.o.  Admission Date: 09/26/2021     LOS: 4 days     Date of Service: 09/30/2021        Test Start Date: 09/29/21  Test Start Time: 2208    Did the patient require initiation of oxygen? No    Did the patient have a SpO2 ? 88% for a cumulative of 5 minutes? No  SpO2 Prior to Initiation of Oxygen: N/A        Test Completed Date: 09/30/21  Test Completed Time: 0327    Additional Comments: For overnight study, patient placed on BiPAP with settings of 10/5, as ordered per Dr. Lawernce IonEickmeyer. The patient did not require an O2 bleed-in during study.

## 2021-09-30 NOTE — Progress Notes
PHYSICAL THERAPY       09/30/21 0900   Type of Note   Type of Note Daily   Time Calculation   Start Time 0900   Stop Time 1000   Time Calculation 60   $$ Professional Contact 1 unit   (coordination of care)   $$ Gait / Mobility 1 unit    $$ PT Therapeutic Activity 1 unit   $$ Wh/chr Mngt/Mob 2 units    Subjective   Subjective Patient seated in recliner, finished with breakfast upon PT arrival. Patient states she was cold last night. Patient agreeable to participate in physical therapy.   Transfers   Transfer: Hospital doctor   Transfer: Sit to stand assist level Moderate assistance   Transfer: Sit to stand type of assist Facilitation of trunk;Facilitation of weight shift;For safety   Transfer: Stand to sit assist level Minimum assistance;Maximum assistance   Transfer: Stand to sit type of assist For safety   Transfer: Stand pivot assist level Moderate assistance   Transfer: Stand pivot type of assist Facilitation of trunk;Facilitation of weight shift;For safety   Transfer Comment Patient with posterior loss of balance when standing at sink, and required maximal assist to lower into wheelchair.   Toileting   Toileting-Adjusting clothing BEFORE using toiet, commode, bedpan or urinal Assist Yes   Toileting-Wipe Self Assist No   Toileting-Adjust clothing AFTER using toilet, commode, bedpan or urinal Assist Yes   Toileting Assist Moderate Assist   Toileting Position Seated   Toileting Transfer   Toilet Transfer Technique Posterior transfer onto receptacle   Toilet Transfer Assist Moderate assistance   Toilet Transfer Equipment Grab bar - left   Daily Care   Patient continent of bladder? No   Urine Occurrence 1   Urine Function Void;Diaper/brief/incontinence pad   Gait   Gait Distance 50 feet  (25)   GAIT: Assist Level Moderate assistance   GAIT: Type of Assist Facilitation of trunk;Facilitation of weight shift;For safety   Gait: Hospital doctor;Wheelchair Follow  (therapist pulled wheelchair behind patient)   Gait: Patterns Decreased gait velocity;Trunk lean forward;Shuffling;Retropulsion   Gait: Deviation Left Decreased stride length   Gait: Deviation Right Decreased stride length   Activity Limited By Complaint of fatigue   Gait Comment Patient impulsively initiated sitting in wheelchair each time.   Wheelchair   Wheelchair Comments [name], ATP from [employer], present for session to discuss equipment options for discharge. As patient will be following up in the ALS Clinic, decision made to acquire loaner manual wheelchair from ALS Clinic, then follow-up with them to determine her definitive wheelchair.   Activity   PT Therapeutic Activities Transported patient to rooftop in wheelchair. Discussed home things with her. Patient appreciative of time outside.   Assessment   Assessment Patient with decreased ambulation distance this session, and required increased assistance for all mobility. Patient's speech also more difficult to understand. Patient is progressing toward functional goals and will continue to benefit from skilled inpatient physical therapy intervention.   Plan   Comments family training; figure out plan for stairs/ramp; determine equipment needs (wheelchair evaluation on 5/23); functional activities; low-moderate intensity exercise (monitor RPE); ALS education   Recommendations   PT Discharge Recommendations Home with Assistance;and;Home Health Setting   Equipment Recommendations Wheelchair - Manual;Wheelchair cushion  (ramp)   Weekly Goals   Weekly Bed Mobility Goals Patient will perform sit to supine with;Patient will perform supine to sit with   Patient will perform sit to supine with  Minimum assistance;Progressing   Patient will perform supine to sit with Minimum assistance;Progressing   Weekly Transfer Goals Patient will complete sit to stand transfer with;Patient will complete stand to sit transfer with;Patient will complete stand pivot transfer with   Patient will complete sit to stand transfer with Stand by assistance;Progressing   Patient will complete stand to sit transfer with Stand by assistance;Progressing   Patient will complete stand pivot transfer with Stand by assistance;Progressing   Weekly Ambulation/Stairs Goals Patient will ambulate;Patient will ascend/descend   Patient will ambulate 150 feet;Least assistive device;Stand by assistance;Progressing   Patient will ascend/descend 4 stairs;Minimum assistance;1 rail;Progressing   Goal(s) for the Stay   Patient will perform at ambulation level;household mobility;With caregiver assistance;Progressing       [name], DPT, NCS  Doctor of Physical Therapy, Board Certified Clinical Specialist in Neurologic Physical Therapy

## 2021-09-30 NOTE — Progress Notes
OCCUPATIONAL THERAPY       09/30/21 1400   Time Calculation   Start Time 1400   Stop Time 1430   Time Calculation 30   $$ Phys ADL Skills 2 units   Subjective   Subjective Patient supine in bed and agreeable to OT.   Toileting   Toileting-Adjusting clothing BEFORE using toiet, commode, bedpan or urinal Assist Yes   Toileting-Wipe Self Assist No   Toileting-Adjust clothing AFTER using toilet, commode, bedpan or urinal Assist Yes   Toileting Assist Moderate Assist   Toileting Position Seated   Toileting Transfer   Toilet Transfer Technique Posterior transfer onto receptacle   Toilet Transfer Assist Moderate assistance   Toilet Transfer Equipment Grab bar - right   Bed Mobility   Bed Mobility: Supine to sit assist level Minimum assistance   Bed Mobility: Supine to sit type of assist Assist with trunk   Transfers   Transfer: Assistive Device Roller Walker   Transfer: Sit to stand assist level Minimum assistance;Moderate assistance   Transfer: Sit to stand type of assist Facilitation of trunk;Facilitation of weight shift;For safety   Transfer: Stand to sit assist level Minimum assistance   Transfer: Stand to sit type of assist For safety   Transfer: Stand pivot assist level Moderate assistance   Transfer: Stand pivot type of assist Facilitation of trunk;For safety   Transfer Comment Son Collier Salina present for transfer training. Son placed and removed gait belt and assisted with stand pivot transfers x3. Son cleared to transfer patient in room with RW.   Assessment   Assessment Patient son cleared to transfer patient in room.   Plan   Plan Comments family training for ADLs, tub bench transfer practice, equipment education   Type of Note   Type of Note Daily            Minus Liberty, OTR/L

## 2021-09-30 NOTE — Progress Notes
OCCUPATIONAL THERAPY       09/30/21 1030   Time Calculation   Start Time 1000   Stop Time 1030   Time Calculation 30   $$ Phys ADL Skills 2 units   Subjective   Subjective Patient sitting in wheelchair and agreeable to OT.   Grooming   Grooming - Wash Face Assist No   Grooming - Brush Teeth Assist No   Grooming Assist Minimal Assist   Grooming Position Standing for ___ % of task   Grooming Comments Steadying assist while standing at sink   Toileting   Toileting-Adjusting clothing BEFORE using toiet, commode, bedpan or urinal Assist Yes   Toileting-Wipe Self Assist No   Toileting-Adjust clothing AFTER using toilet, commode, bedpan or urinal Assist Yes   Toileting Assist Moderate Assist   Toileting Position Seated   Toileting Transfer   Toilet Transfer Technique Posterior transfer onto receptacle   Toilet Transfer Assist Moderate assistance   Toilet Transfer Equipment Grab bar - right   Transfers   Transfer: Assistive Device Roller Walker   Transfer: Sit to stand assist level Moderate assistance   Transfer: Sit to stand type of assist Facilitation of trunk;Facilitation of weight shift;For safety   Transfer: Stand to sit assist level Minimum assistance   Transfer: Stand to sit type of assist For safety   Transfer: Stand pivot assist level Moderate assistance   Transfer: Stand pivot type of assist For safety   Transfer Comment Patient retropulsive with stands and requiring moderate-maximal assist for ambulation in room.   Plan   Plan Comments family training for ADLs, tub bench transfer practice, equipment education   Type of Note   Type of Note Daily            Nicola Girt, OTR/L

## 2021-10-01 NOTE — Rehab Team Conference
Team Conference Note     Date of Admission:  09/26/2021  Date of Team Conference:  10/01/2021   Candace Keith is a 81 y.o. female.     DOB: [birth year]                  MRN#:  [id number]    Team Conference   Attendees: Jana Hakim Attending Physician; Audie Clear MD, Resident Physician; Dellia Beckwith SW, Social Work; Michaelyn Barter RN, Program Coordinator; Daleen Snook, OTR, Occupational Therapy; [name], DPT, Physical Therapy; Hulda Marin SLP, Speech Therapy; Donney Rankins CTRS, Recreational Therapist; Nicole Kindred, Pharmacy; Mellody Dance PhD, ABPP, Neuropsychology; Sylvan Cheese RN, Nursing    Medical Update: Candace Keith is a 81 y.o. female admitted to rehabilitation for new dx ALS  Medical updates this week: Adjusting BP meds  Medical barriers to discharge: None    Neuropsychology: New ALS diagnosis. Episodes of low mood. Enjoys sewing. Enjoys piano and gardening.    Team Goal:  Patient will perform: at ambulation level, household mobility, With caregiver assistance, Progressing     Pt will perform basic care and transfer with: Minimum assistance     Discharge Planning     Discharge Date:  10/07/2021    ? Pt lives at home with her son, Theron Arista.  ? Per pt, Theron Arista, is her DPOAH. Pt confirms she wants him to remain her DPOAH and only wants Theron Arista to be able to call for updates.   ? Pt notes her brother is very supportive and comes by often to provide her with support.  ? Pt's home has 11 steep stairs from the garage to get to the main level or on the outside has about 10 stairs total, however, they are two steps at at with a landing multiple times as she gets up to the main level.  ? Pt was independent prior to admission. Including managing her own medication. States she is very sensitive to medications and was only on one medication. Fills at [facility] in [place].  ? Pt has two RW. One in basement and one on main floor.  ? Pt also has a 4WW at home.  ? Pt has a Bemer Mat in her basement she has been using 3 times per day. Pt is concerned she won't be able to go up and down the stairs much longer.  ? Son feels pt needs to continue to do these things not to lose strength. 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 He voiced understanding.  ? SW discussed needing to do a sleep study and ABG to start process for obtaining a Bi-Pap. Explained it would be an additional lab draw and overnight oximetry, which may affect her sleep. They both voiced understanding and agreeable to proceed with these tests to get pt what she needs.    ? Marcelino Duster and Theron Arista ask about hospice and the process for that. SW provided education on hospice. Son was appreciative of education as he thought hospice was for the last couple of days of life. SW explained that goals of care discussions and prognosis discussions would take place with the ALS clinic.   ? SW communicated with Leighton Parody at [facility] regarding Bi-PAP, Suctioning and Cough Assist. SW sent orders. Awaiting approval.    Plan Consistent supervision on d/c as communication may impact ability to call  for help. Ongoing ST targeting motor speech and AAC; home health PT/OT/ST    DME Ramp; hospital bed; getting a loaner manual wheelchair from ALS Clinic, then PT & DME specialist will assess needs in ALS clinic;   owns roller walker, drop arm bedside commode, grab bar by toilet, HHSH, tub transfer bench    Rehabilitation Plan     Progress  Introduction to AAC modalities and motor speech strategies  Pt is tolerating regular/thin diet with strict strategy use. Meds as tolerated.   Targeting dysphagia peripherally for acute changes  Transfer and equipment education with son Theron Arista    Barriers/Concerns  Diet with swallow strategies and possibility of aspiration given hx of ALS  Dysarthria (increased difficulty expressing daily wants/needs and participating in conversation)  Family members being on same page with patient--a family meeting might be beneficial  Variable mobility day to day  Skin integrity education. There is an order for sleep bundle 2200-0600. Son does not want pt disturbed between 2200-0600. Potential for skin breakdown.    Plan  family training; figure out plan for stairs/ramp; determine equipment needs (wheelchair evaluation on 5/23); functional activities; low-moderate intensity exercise (monitor RPE); ALS education  family training for ADLs, tub bench transfer practice, equipment education   No c/o pain. No skin issues at this time. Pt's son uses call light for pt assistance.  Impaired mobility. Impaired ADL's.    Weekly Team Goals  Prioritize frequent, thorough oral care  Encourage use of speech strategies during conversation with staff members   Up to chair for 3/3 meals    Ongoing education with son  Potential family meeting?  Video swallow study to determine baseline    Goals   Weekly Goals  Weekly Bed Mobility Goals: Patient will perform sit to supine with, Patient will perform supine to sit with  Patient will perform sit to supine with: Minimum assistance, Progressing  Patient will perform supine to sit with: Minimum assistance, Progressing  Weekly Transfer Goals: Patient will complete sit to stand transfer with, Patient will complete stand to sit transfer with, Patient will complete stand pivot transfer with  Patient will complete sit to stand transfer with: Stand by assistance, Progressing  Patient will complete stand to sit transfer with: Stand by assistance, Progressing  Patient will complete stand pivot transfer with: Stand by assistance, Progressing  Weekly Ambulation/Stairs Goals: Patient will ambulate, Patient will ascend/descend  Patient will ambulate: 150 feet, Least assistive device, Stand by assistance, Progressing  Patient will ascend/descend: 4 stairs, Minimum assistance, 1 rail, Progressing Quality Indicators     Swallowing/Nutritional Status: Regular food  Bladder Continence: 3-Incontinent daily (At least 1 episode/day)  Bowel continence: 0-Always continent

## 2021-10-01 NOTE — Rehab Team Social Worker/Case Management Support/Ongoing Services/DME
Pt lives at home with her son, Collier Salina.  Per pt, Collier Salina, is her [facility]. Pt confirms she wants him to remain her [place] and only wants Collier Salina to be able to call for updates.   Pt notes her brother is very supportive and comes by often to provide her with support.  Pt's home has 11 steep stairs from the garage to get to the main level or on the outside has about 10 stairs total, however, they are two steps at at with a landing multiple times as she gets up to the main level.  Pt was independent prior to admission. Including managing her own medication. States she is very sensitive to medications and was only on one medication. Fills at [facility] in [place].  Pt has two RW. One in basement and one on main floor.  Pt also has a 4WW at home.  Pt has a Bemer Mat in her basement she has been using 3 times per day. Pt is concerned she won't be able to go up and down the stairs much longer.  Son feels pt needs to continue to do these things not to lose strength. 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 He voiced understanding.  SW discussed needing to do a sleep study and ABG to start process for obtaining a Bi-Pap. Explained it would be an additional lab draw and overnight oximetry, which may affect her sleep. They both voiced understanding and agreeable to proceed with these tests to get pt what she needs.    Sharyn Lull and Collier Salina ask about hospice and the process for that. SW provided education on hospice. Son was appreciative of education as he thought hospice was for the last couple of days of life. SW explained that goals of care discussions and prognosis discussions would take place with the ALS clinic.   SW communicated with Izola Price at [facility] regarding Bi-PAP, Suctioning and Cough Assist. SW sent orders. Awaiting approval.

## 2021-10-01 NOTE — Progress Notes
Pt's son verbalized pt is not to be disturbed between the hours of 2200-0600. Son verbalized pt only takes Norvasc for meds. Son refused Lovenox for pt at HS. No additional concerns expressed.

## 2021-10-01 NOTE — Progress Notes
OCCUPATIONAL THERAPY       10/01/21 1300   Time Calculation   Start Time 1300   Stop Time 1330   Time Calculation 30   $$ Phys ADL Skills 2 units   Subjective   Subjective Patient sitting in chair finishing lunch and agreeable to OT.   Toileting   Toileting-Adjusting clothing BEFORE using toiet, commode, bedpan or urinal Assist No   Toileting-Wipe Self Assist Yes   Toileting-Adjust clothing AFTER using toilet, commode, bedpan or urinal Assist No   Toileting Assist Minimal Assist   Toileting Position Seated   Toileting Comments Steadying assist while patient manages pants   Transfers   Transfer: Assistive Device Roller Walker   Transfer: Sit to stand assist level Minimum assistance   Transfer: Sit to stand type of assist Facilitation of trunk;For safety   Transfer: Stand to sit assist level Minimum assistance   Transfer: Stand to sit type of assist For safety   Transfer: Stand pivot assist level Minimum assistance   Transfer: Stand pivot type of assist Facilitation of trunk;For safety   Transfer Comment Patient ambulated ~60 feet in hallway with RW and minimal assist. Requesting to go outside for fresh air.   Assessment   Assessment Family training progressing well.   Plan   Plan Comments family training for ADLs, tub bench transfer practice, equipment education   Recommendations   OT Discharge Recommendations Home with family assist;Home with Home Health   OT Equipment Recommendations Bath tub bench;Commode;Grab bar(s);Shower hose   Education   Comments Reviewed recommendations for home bathroom equipment. Emailed brother in [employer] links to purchase tub transfer bench and hand held shower head.   Type of Note   Type of Note Daily       Patient requires a bedside commode due to inability to access bathroom via wheelchair. Patient requires a drop arm for safe transfers.         Minus Liberty, OTR/L

## 2021-10-01 NOTE — Progress Notes
PHYSICAL THERAPY       10/01/21 0830   Type of Note   Type of Note Daily   Time Calculation   Start Time 0830   Stop Time 0930   Time Calculation 60   $$ Professional Contact 1 unit   (coordination of care)   $$ Gait / Mobility 2 units    $$ PT Therapeutic Activity 1 unit   $$ Therapeutic Exercise  1 unit    Subjective   Subjective Patient seated in recliner upon PT arrival. Patient's son laying on couch. Patient agreeable to participate in physical therapy. Patient's son arrives to gym later in session.   Transfers   Transfer: Product/process development scientist: Sit to stand assist level Minimum assistance   Transfer: Sit to stand type of assist Facilitation of trunk   Transfer: Stand to sit assist level Minimum assistance   Transfer: Stand to sit type of assist For safety   Transfer: Stand pivot assist level Minimum assistance   Transfer: Stand pivot type of assist Facilitation of trunk;For safety   Toileting   Toileting-Adjusting clothing BEFORE using toiet, commode, bedpan or urinal Assist Yes   Toileting-Wipe Self Assist Yes   Toileting-Adjust clothing AFTER using toilet, commode, bedpan or urinal Assist Yes   Toileting Assist Minimal Assist   Toileting Position Seated   Toileting Transfer   Toilet Transfer Technique Posterior transfer onto receptacle   Toilet Transfer Assist Minimum assistance   Toilet Transfer Equipment Grab bar - left   Daily Care   Patient continent of bladder? Yes, void   Urine Occurrence 1   Urine Function Void;Diaper/brief/incontinence pad   Urine Color Unable to assess or not observed   Urine Description Unable to assess or not observed   Patient continent of bowel? Yes, no bowel program   Stool Occurrence 1   Stool Amount Small   Stool Appearance Formed   Stool Color Brown   Last Bowel Movement Date 10/01/21   Gait   Gait Distance 200 feet  (150)   GAIT: Assist Level Minimum assistance   GAIT: Type of Assist For safety   Gait: Hospital doctor;Wheelchair Follow  (therapist pulled wheelchair behind patient)   Gait: Patterns Shuffling;Trunk lean forward   Gait: Deviation Left Decreased stride length   Gait: Deviation Right Decreased stride length   Activity Limited By Complaint of fatigue   Gait Comment Provided patient's son, Theron Arista, with verbal and visual education for how to assist patient during ambulation. Educated she should not walk alone due to high risk of falls. Theron Arista verbalized understanding.   Stairs   Stair: Assist Level Minimum assistance   Stairs: Number Climbed 8   Stairs Management Technique Non-reciprocal   Stairs: Assistive Device Two Rails   Stairs Activity Limited By Complaint of fatigue   Activity   PT Therapeutic Activities Toileting activity.   NuStep 7-9 Minutes;Level 1  (bilateral upper & lower extremities)   Other Activity NuStep: Seat 6, arms 6. Activity performed for generalized strengthening/endurance.   Assessment   Assessment Patient with improved mobility today, possibly due to improved sleep overnight; patient did not demonstrate any losses of balance. Patient's son, Theron Arista, receptive to family training.   Plan   Comments family training; figure out plan for stairs/ramp;  functional activities; low-moderate intensity exercise (monitor RPE); ALS education       [name], DPT, NCS  Doctor of Physical Therapy, Board Certified Clinical Specialist in Neurologic Physical Therapy

## 2021-10-01 NOTE — Progress Notes
SPEECH-LANGUAGE PATHOLOGY     10/01/21 1330   Behavior   Comments* Patient seated upright in wheelchair upon SLP arrival. Patient's son present for duration of therapy session. Patient remained engaged and participatory throughout.   Dysphagia Goals   Therapy Activity Comments SLP provided extensive education regarding videoswallow study to be completed next date. Per Patient, intermittent swallowing difficulty occurring with solids and liquids. Patient reporting globus sensation and coughing with thin liquid intake. Provided education regarding rationale of videoswallow study, procedure, and further POC following completion of study. Utilized online video of videoswallow study to provide video demonstration of expectations. Patient and son verbalized understanding. All questions answered to satisfaction.   Motor Speech Goals   Therapy Activities Articulation;Respiration   Articulation Unstructured conversation   Therapy Activity Comments Intelligibility ~50% in unstructured conversation with unfamiliar listener this date. Provided education on use and importance of speech intelligibility strategies. Patient encouraged to slow rate of speech, overarticulate, and speak on exhalation. Patient required maximal verbal cues initially, fading to minimal verbal cues for implementation of strategies. Patient encouraged to utilize strategies outside of therapy session. Patient verbalized understanding.   Verbal Expression Goals   Therapy Activities Use of an augmentative communication device   Therapy Activity Comments Attempted to trial use of AAC device this date. Patient politely declined at this time.     Assessment Videoswallow study scheduled for next date to determine aspiration risk and diet recommendations.    Plan Motor Speech  -Target respiration in the context of sustained phonation/single words/short phrases  -Read phrases/sentences for strategy use  -Family to bring in voice amplification device  ?  AAC  -Ongoing education of AAC systems/options  -Voice banking of consistently used phrases  -Trial applications on iPad  -Trial applications on pt's smartphone as able (play store is name of pt's app store)  -Ongoing introduction to [employer]  -Review text to speech applications?  ?  -consider introducing EMST-Lite to strengthen expiratory muscles for cough, swallow, and phonation  - videoswallow study 10/02/21     Hortencia Conradi, MS, CCC-SLP

## 2021-10-01 NOTE — Progress Notes
OCCUPATIONAL THERAPY       10/01/21 1000   Time Calculation   Start Time 1000   Stop Time 1030   Time Calculation 30   $$ Professional Contact 1 unit   $$ Phys ADL Skills 2 units   Subjective   Subjective Patient sitting in recliner and agreeable to OT. Son present for family training.   Tub/Shower Geneticist, molecular Minimum assistance   Tub/Shower Comments Simulated use of tub transfer with bench. Pt able to advance LLE over tub using BUE while sitting in bench. Minimal assist to stand from bench. Educated son on height adjustment for bench and cutting shower curtain to keep floor dry.   Transfers   Transfer: Hospital doctor   Transfer: Sit to stand assist level Minimum assistance   Transfer: Sit to stand type of assist Facilitation of trunk;For safety   Transfer: Stand to sit assist level Minimum assistance   Transfer: Stand to sit type of assist For safety   Transfer: Stand pivot assist level Minimum assistance   Transfer: Stand pivot type of assist Facilitation of trunk;For safety   Transfer Comment Patients son assisted with all transfers and mobility during session. Patient ambulated ~100 feet with RW and minimal assist.   Assessment   Assessment Family training progressing well.   Plan   Plan Comments family training for ADLs, tub bench transfer practice, equipment education   Type of Note   Type of Note Daily     Nicola Girt, OTR/L

## 2021-10-01 NOTE — Rehab Team Physician
Candace Keith is a 81 y.o. female admitted to rehabilitation for new dx ALS  Medical updates this week: Adjusting BP meds  Medical barriers to discharge: None

## 2021-10-02 ENCOUNTER — Inpatient Hospital Stay: Admit: 2021-10-02 | Discharge: 2021-10-02 | Payer: MEDICARE | Primary: Family

## 2021-10-02 MED ORDER — BARIUM SULFATE 40 % (W/V), 30% (W/W) PO PSTE
10 mL | Freq: Once | ORAL | 0 refills | Status: CP
Start: 2021-10-02 — End: ?
  Administered 2021-10-02: 19:00:00 10 mL via ORAL

## 2021-10-02 MED ORDER — BARIUM SULFATE 81 % (W/W) PO POWD
60 mL | Freq: Once | ORAL | 0 refills | Status: CP
Start: 2021-10-02 — End: ?
  Administered 2021-10-02: 19:00:00 60 mL via ORAL

## 2021-10-02 NOTE — Progress Notes
ATTESTATION    I personally performed the key portions of the E/M visit, discussed case with resident and concur with resident documentation of history, physical exam, assessment, and treatment plan unless otherwise noted.   Functional history reviewed as below and discussed with patient face to face.  On track for discharge home 10/07/21.    Staff name:  Harland German, MD Date: 10/02/2021       Physical Medicine & Rehabilitation Progress Note       Today's Date:  10/02/2021  Admission Date: 09/26/2021  LOS: 6 days  Insurance: [facility] MEDICARE    Principal Problem:    ALS (amyotrophic lateral sclerosis) (HCC)  Active Problems:    Essential hypertension    Mitral valve insufficiency    Declining functional status    Severe malnutrition (HCC)    Risk for falls    Impaired mobility and activities of daily living    Dysarthria                       Assessment/Plan:     Candace Keith is a 81 y.o.  female admitted to [facility] on 09/26/2021 with the following issues: ALS    Rehabilitation Plan  Tentative discharge date: 10/07/2021  Rehabilitation: Patient will continue with comprehensive therapies including physical therapy, occupational therapy, speech & language pathology, specialized rehab nursing, neuropsychology and physiatry oversight.    Goals: at ambulation level, household mobility, With caregiver assistance, Progressing  Recommended therapy after discharge: Home with Assistance, and, Home Health Setting  PT recommended equipment: Wheelchair - Manual, Wheelchair cushion (ramp)  OT recommended equipment: Transport planner, Commode, Grab bar(s), Shower hose    Daily Functional Update:  Transfers        Transfer: Assistive Device: Nurse, adult (10/01/2021  1:00 PM)    Transfer: Sit to stand assist level: Minimum assistance (10/01/2021  1:00 PM)    Transfer: Stand pivot assist level: Minimum assistance (10/01/2021  1:00 PM)    No data recorded   Gait/ Mobility Gait: Assistive Device: Nurse, adult; Wheelchair Follow (therapist pulled wheelchair behind patient) (10/01/2021  8:30 AM)    GAIT: Assist Level: Minimum assistance (10/01/2021  8:30 AM)    Gait Distance: 200 feet (150) (10/01/2021  8:30 AM)    No data recorded  No data recorded   Toileting        Toileting Assist: Minimal Assist (10/01/2021  1:00 PM)    No data recorded  Toilet Transfer Assist: Minimum assistance (10/01/2021  8:30 AM)     Dressing LE Dressing Assist: Moderate Assist (09/29/2021 11:00 AM)    UE Dressing Assist: Stand By Assist (09/29/2021 11:00 AM)         Current Medical Problems/Risks of Medical Complications/Management    ALS  Tetraparesis, Dysarthria/bulbar weakness, Oropharyngeal dysphagia  Fasciculations, hyperreflexia  Fall Risk  Impaired mobility and ADL  - Riluzole has been discussed with patient and son wants to hold on medication addition   - PT/OT/SLP to eval and treat   - regular diet / thin liquids per SLP assessment 5/22  - Outpatient referral for ALS clinic when discharged (requested by neuro)  - Neuromuscular consulted 5/23 and will establish in outpatient ALS clinic  - Neuropsych to eval and treat  ?  Chronic respiratory failure  Neuromuscular restrictive lung disease  Sleep disturbance  - BIPAP at night  - cough assist machine during the day prn, suction prn  -  added melatonin 1mg  QHS prn 5/22  - sleep bundle ordered  - patient will require indefinite use of BIPAP at night, cough assist and suction as needed, due to progressive neuromuscular disease and chronic respiratory failure   > completed overnight pulse oximetry and ABG    HTN  - continue amlodipine 2.5mg  daily   ?  Hypothyroidism   - discontinued PTA synthroid (patient refusing, will d/c per patient request)  - Free T4 09/22/21 0.8-wnl, TSH 13.79 elevated  ?  Mitral regurgitation   - follows with Dr. Geronimo Boot with [facility]  ?  Left breast mass  - f/u with oncologist outpatient  ?  Malnutrition Assessment  Malnutrition present on admission  ICD-10 code E43: Chronic illness/Severe malnutrition  ??????Weight loss: >?5% x 1 month, Energy intake: 75% or less of estimated energy requirement for 1 month or more ?  Malnutrition Interventions: Small frequent meals/snacks q2-3 hrs, 6 total. Meals BID + 1/2 carton of [employer] 1.4 or 1.0, 4x per day.  Our dietician will continue to follow.  ?  Pain Management: pain is well controlled, heat / ice PRN, topical medication and Tylenol PRN  ?  Skin: There are no pressure sores currently. and Encourage the patient to continue to inspect their skin and perform pressure relief.  ?  Bowel: continent of bowel  ?  Bladder: continent of bladder, voiding with negative admission BVI x3  ?  BMI: 18.55kg/m2  - Consult dietician for concerns of malnutrition.   ?  Mental Health: consult neuropsychology to provide support / counseling    DVT Prophylaxis: enoxaparin (LOVENOX) syringe 40 mg  QDAY(21)     Code status:  DNAR-FI    Delmer Islam PGY-3  Physical Medicine and Rehabilitation Resident  On Voalte, Pager [phone]    Subjective     NAEO. Seen walking with therapy. Slept so-so last night and thinks it will be better at home.  Denies any acute concerns.  Excited to go home next week.    Objective                          Vital Signs: Last Filed                 Vital Signs: 24 Hour Range   BP: 97/74 (05/25 0531)  Temp: 36.2 ?C (97.2 ?F) (05/25 0531)  Pulse: 65 (05/25 0531)  Respirations: 18 PER MINUTE (05/25 0531)  SpO2: 97 % (05/25 0531)  O2 Percent: 21 % (05/24 2227)  O2 Device: CPAP/BiPAP (05/25 0531) BP: (97-144)/(71-74)   Temp:  [36.2 ?C (97.2 ?F)-36.8 ?C (98.3 ?F)]   Pulse:  [65-74]   Respirations:  [16 PER MINUTE-18 PER MINUTE]   SpO2:  [97 %-99 %]   O2 Percent:  [21 %]   O2 Device: CPAP/BiPAP     Vitals:    09/26/21 1311 09/29/21 0440   Weight: 49 kg (108 lb 0.4 oz) 48.7 kg (107 lb 5.8 oz)       Intake/Output Summary:  (Last 24 hours)  No intake or output data in the 24 hours ending 10/02/21 7846     Last Bowel Movement Date: 09/29/21    Labs--reviewed  No results found for this visit on 09/26/21 (from the past 24 hour(s)).    Medications:  Scheduled Meds:amLODIPine (NORVASC) tablet 2.5 mg, 2.5 mg, Oral, QDAY  enoxaparin (LOVENOX) syringe 40 mg, 40 mg, Subcutaneous, QDAY(21)  Protandim NAD Synergizer (Patient's Own Medication), 1  Dose, Oral, QDAY  Protandim NRF1 Synergizer (Patient's Own Medication), 1 Dose, Oral, QDAY  Protandim NRF2 Synergizer (Patient's Own Medication), 1 Dose, Oral, QDAY    Continuous Infusions:  PRN and Respiratory Meds:acetaminophen Q4H PRN, aluminum/magnesium hydroxide Q4H PRN, other medication PRN, bisacodyL QDAY PRN, melatonin QHS PRN, ondansetron Q6H PRN       Physical Exam  General: Alert, conversant, NAD, seen walking with a RW w/ therapy  HEENT: NCAT, oral mucosa moist   Heart: Extremities appear well perfused  Lungs: non labored breathing. Symmetrical chest expansion  Abdomen: non-distended  Extremities: no pedal edema  Neuro: Alert, follows commands. Appropriate processing time with conversation. Moves all limbs spontaneously., slow speech  Psych: pleasant mood, appropriate affect    Labs & Therapy Notes Reviewed    Delmer Islam, DO

## 2021-10-02 NOTE — Progress Notes
PHYSICAL THERAPY       10/02/21 0930   Type of Note   Type of Note Daily   Time Calculation   Start Time 0930   Stop Time 1030   Time Calculation 60   $$ Professional Contact 1 unit   (coordination of care)   $$ Gait / Mobility 1 unit    $$ PT Therapeutic Activity 3 units   Subjective   Subjective Patient seated in recliner upon PT arrival; patient's son asleep on couch during session. Patient pleasant and agreeable to participate in physical therapy.   Transfers   Transfer: Product/process development scientist: Sit to stand assist level Contact guard assistance   Transfer: Sit to stand type of assist For safety   Transfer: Stand to sit assist level Contact guard assistance   Transfer: Stand to sit type of assist For safety   Toileting   Toileting-Adjusting clothing BEFORE using toiet, commode, bedpan or urinal Assist No   Toileting-Wipe Self Assist No   Toileting-Adjust clothing AFTER using toilet, commode, bedpan or urinal Assist No   Toileting Assist Minimal Assist   Toileting Position Seated   Toileting Comments Steadying assist   Nutritional therapist Technique Posterior transfer onto receptacle   Toilet Transfer Assist Minimum assistance   Toilet Transfer Equipment Grab bar - left   Daily Care   Patient continent of bladder? Yes, void   Urine Occurrence 1   Urine Function Void   Urine Color Unable to assess or not observed   Urine Description Unable to assess or not observed   Patient continent of bowel? Yes, no bowel program   Stool Occurrence 1   Stool Amount Medium   Stool Appearance Formed   Stool Color Tan;Brown   Last Bowel Movement Date 10/02/21   Gait   Gait Distance 150 feet   GAIT: Assist Level Contact guard assistance   GAIT: Type of Assist For safety   Gait: Hospital doctor;Wheelchair Follow  (therapist pulled wheelchair behind patient)   Gait: Patterns Shuffling;Trunk lean forward   Gait: Deviation Left Decreased stride length   Gait: Deviation Right Decreased stride length   Activity Limited By Complaint of fatigue   Stairs   Stairs Comment Patient declined stair mobility this session.   Activity   PT Therapeutic Activities Toileting activity. Completed puzzle in sitting for visual scanning activity and fine motor strengthening activity; patient enjoyed it.   Assessment   Assessment Patient contact guard assist for mobility today, and did not demonstrate any posterior losses of balance. Patient is demonstrating improved awareness of when she needs to rest. Patient will benefit from continued family training prior to discharge home next week.   Plan   Comments family training--ALS education, encourage installation of ramp, low-moderate intensity exercise is beneficial, stretching program to address spasticity, stair mobility if they don't get a ramp, wheelchair management (loaner wheelchair to arrive from ALS clinic prior to discharge), have patient's son mobilize patient (walking, transfers, toileting); functional activities; NuStep; patient likes puzzles; D/C QI on Monday, 5/29       [name], DPT, NCS  Doctor of Physical Therapy, Board Certified Clinical Specialist in Neurologic Physical Therapy

## 2021-10-02 NOTE — Consults
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 Patient presents with normal oropharyngeal swallow at this time. Swallow safety is preserved; swallow efficiency is preserved. Recommend continuation of regular textures and thin liquids with utilization of safe swallowing strategies listed below.     Oral Stage Summary*: Oral phase judged to be largely within functional limits. Intermittent premature spillage of liquid bolus to the valleculae. Mastication was timely and efficient. A/P lingual motion was brisk during bolus transport. Oral clearance was complete, with the exception of mild tongue base residue.     Pharyngeal Stage Summary*: Pharyngeal phase judged to be largely within functional limits. Velopharyngeal elevation in tact. Initiation of the pharyngeal swallow at the level of the valleculae. Laryngeal elevation judged to be adequate. Anterior hyoid excursion and epiglottic inversion were complete. Laryngeal vestibular closure judged to be adequate, with the exception of intermittent cases of flash laryngeal penetration. Minimal-moderate pharyngeal residue appreciated intermittently post swallow. Residue cleared via repeat swallow and liquid wash.    Swallow Recommendations  PO: Regular, Thin liquids  Medications: 1 at a time, As tolerated  Supervision: No supervision needed  Positioning: Up in chair for all meals  Swallow Strategies: Small bites/sips, Slow rate of intake, Alternate liquids/solids  Oral Hygiene: 3 times per day    Plan: Continue treatment daily    Prognosis*: Guarded (Due to diagnosis)    NOMS Dysphagia Rating*: 7-Normal -Ability to eat indep not limited by swallow function. Swallow safe/efficient for all consistencies. Compensatory strategies effectively used when needed.    Penetration Aspiration Scale*: 2 - Material enters laryngeal vestibule, remains above vocal folds & is ejected    Objective  Relevant Med Background: [name] is a?81 year old female?with?a past medical history of hypertension and mitral valve regurgitation/insufficiency.?Patient was?admitted on 09/22/2021 with chief complaint of generalized weakness, debility, failure to thrive, speech changes, and?frequent falls. ?Patient was brought to the [facility] by her son. Neurology was consulted. ?No evidence of stroke. 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  ?  Patient seen and evaluated on admission to [facility] on 09/26/2021.  Denies any fevers, chills, chest pain, shortness of breath is doing well.  Had multiple questions regards to multiple multivitamins as well as nutritional supplements.  Otherwise is looking forward to therapies and was without any other questions or concerns.  Pertinent Dysphagia History: Report of coughing with liquids over the last 6 months. Pt additionally reports globus sensation with foods.    CT Head 09/22/21:   IMPRESSION   1. ?No acute intracranial hemorrhage or mass effect.   2. ?Mild generalized cerebral and cerebellar atrophy. Small chronic   appearing bilateral thalamic, right lentiform nucleus, and right corona   radiata lacunar infarcts and moderate patchy supratentorial white matter   disease, likely reflecting chronic microvascular ischemic changes.     Handedness: Right  Hearing: WFL  Lives With: Son  Receives Help From: Family  Psychosocial Status: Willing and Cooperative to Participate  Persons Present: Son    Subjective  Pain: Patient has no complaint of pain  Pain Level Current: No pain  Trach Presence: No  Feeding Tube Present During Eval: None    Nutrition  Nutrition Prior To Hospitalization: Oral, Regular, Thin  Liquids  Current Form Of Nutrition: Oral, Regular, Thin Liquids    Views / Seating*  Views / Seating: Lateral View    PO Presentation  Presentations: Therapist Fed, Patient Fed Self  Thin Liquid: Cup, Consecutive Swallows  Other Consistencies: Puree, Regular solids    Swallow Strategies  Small Bites/Sips: Effective  Slow Rate of Intake: Effective  Alternate Solids/Liquids: Effective    Education*  Persons Educated: Pt/Family  Barriers To Learning: None Noted  Interventions: Family Educated, Repetition of Instructions  Teaching Methods: Verbal  Topics: Dysphagia  Patient Response: Verbalized Understanding  Goal Formulation: With Pt/Family    Therapist: Hortencia Conradi, MS,CCC-SLP  Date: 10/02/2021

## 2021-10-02 NOTE — Case Management (ED)
Case Management Progress Note    NAME:Candace Keith                          MRN: [id number]              DOB:1941-05-10          AGE: 81 y.o.  ADMISSION DATE: 09/26/2021             DAYS ADMITTED: LOS: 6 days      Today's Date: 10/02/2021    PLAN:     Discharge planning to home with son Theron Arista.    [facility] for RN, PT, OT and ST.    [facility] will deliver drop arm BSC to Rehab room on Friday.    Continue to work with [employer] on Bi-PAP, Cough Assist and suctioning.     Expected Discharge Date: 10/07/2021   Is Patient Medically Stable: No, Please explain: Rehab goals  Are there Barriers to Discharge? no    INTERVENTION/DISPOSITION:   Discharge Planning                  SW sent order to [facility] for drop arm BSC. They will deliver to pt's room on Friday.    SW received a call from [facility] and they can accept pt for [facility] RN, PT, OT and ST. They likely can't start service until Tuesday or Wednesday due to holiday weekend.     SW left message for  Zoar at [place] to follow up on Bi-PAP, Cough Assist and Suctioning. Awaiting response.   Transportation                  Support                  Info or Referral                  Positive SDOH Domains and Potential Barriers                    Medication Needs                                                                                                                                                          Editor, commissioning                  Other                  Discharge Disposition  Selected Continued Care - Admitted Since 09/26/2021    No services have been selected for the patient.           Julious Oka, LMSW   Available on voalte  [phone]

## 2021-10-02 NOTE — Case Management (ED)
Case Management Progress Note    NAME:Candace Keith                          MRN: [id number]              DOB:21-Sep-1940          AGE: 81 y.o.  ADMISSION DATE: 09/26/2021             DAYS ADMITTED: LOS: 5 days      Today's Date: 10/01/2021    PLAN:     Discharge to home with son, Theron Arista.    Referral to [facility] for RN, PT, OT and ST.    Referral and orders sent to [facility] for Bi-PAP, cough assist and suctioning. Awaiting approval.    Will send order for drop arm bedside commode to be delivered to home from [facility].    Pt declined hospital bed.  Pt declined setting up MyChart.    Expected Discharge Date: 10/07/2021   Is Patient Medically Stable: No, Please explain: Rehab goals  Are there Barriers to Discharge? no    INTERVENTION/DISPOSITION:  ? Discharge Planning                  Attended team conference.    SW met with pt and son to update on conference and recommendations. Pt's daughter, Marcelino Duster, present for part of the conversation.    SW provided update on recommendations of discharge for d/c on Tuesday 10/07/2021. Pt is wanting to go home earlier if possible. SW explained need to wait to find out about the respiratory equipment. Also discussed having another family member attend therapy sessions for education on caring for pt. Discussed pt will need someone with her at all times when she is mobilizing. She will not be the primary person to cook and clean as she has in the past. She won't be able to go up and down the stairs 3 times per day. SW also discussed priority in having a ramp installed. SW asked pt about the hospital bed as Theron Arista and Marcelino Duster both think it would be beneficial. Pt declines hospital bed. Wants to sleep in her own bed. They asked about prognosis or how long pt has to live. SW deferred this to ALS clinic.    Marcelino Duster is going to review her schedule to determine when she can participate in family training with pt.  She will send SW email with plan.    SW reached out to [place] with [employer]. He is hopeful to have a plan/approval for Bi-PAP, Cough Assist and suctioning tomorrow.       ? Transportation                 ? Support                 ? Info or Referral                 ? Positive SDOH Domains and Potential Barriers                   ? Medication Needs                                                                                                                                                         ?  Financial                 ? Legal                 ? Other                 ? Discharge Disposition                                                                                                                                                      Selected Continued Care - Admitted Since 09/26/2021    No services have been selected for the patient.           Julious Oka, LMSW   Available on voalte  [phone]

## 2021-10-02 NOTE — Progress Notes
OCCUPATIONAL THERAPY       10/02/21 0800   Time Calculation   Start Time 0800   Stop Time 0900   Time Calculation 60   $$ Phys ADL Skills 2 units   $$ Therapeutic Activity 2 unit - 30 min   Subjective   Subjective Patient sitting in chair finishing breakfast and agreeable to OT.   Grooming   Grooming - Wash Face Assist No   Grooming - Brush Teeth Assist No   Grooming - Comb/Brush Hair Assist No   Grooming Assist Stand By Assist   Grooming Position Standing for ___ % of task   Grooming Comments Pt stood at sink x6 minutes with supervision   Toileting   Toileting-Adjusting clothing BEFORE using toiet, commode, bedpan or urinal Assist No   Toileting-Wipe Self Assist No   Toileting-Adjust clothing AFTER using toilet, commode, bedpan or urinal Assist No   Toileting Assist Minimal Assist   Toileting Position Seated   Toileting Comments Steadying assist during pants management   Toileting Transfer   Toilet Transfer Technique Posterior transfer onto receptacle   Toilet Transfer Assist Minimum assistance   Toilet Transfer Equipment Grab bar - left   Transfers   Transfer: Assistive Device Roller Walker   Transfer: Sit to stand assist level Contact guard assistance   Transfer: Sit to stand type of assist For safety   Transfer: Stand to sit assist level Contact guard assistance   Transfer: Stand to sit type of assist For safety   Transfer: Stand pivot assist level Contact guard assistance   Transfer: Stand pivot type of assist For safety   Transfer Comment Patient ambulated 60 feet with RW and contact guard assist.   Activity   Therapeutic Activities Patient stood at elevated mat while performing side steps along mat x8 feet x15 reps to simulate ambulation along sink in bathroom. Completed perfection puzzle while standing with increased time for fine motor skills. Tolerated standing ~15 minutes without rest break.   Assessment   Assessment Pt progressing toward goals.   Plan   Plan Comments family training for ADLs, tub bench transfer practice, equipment education   Type of Note   Type of Note Daily            Nicola Girt, OTR/L

## 2021-10-02 NOTE — Progress Notes
SPEECH-LANGUAGE PATHOLOGY  DAILY NOTE     10/02/21 1030   Behavior   Comments* Patient seated upright in recliner, son present throughout. Primarily an educational session related to upcoming VFSS as well as AAC options. Needs met, son present upon SLP exit.   Dysphagia Goals   Therapy Activity Comments SLP provided re-education regarding rationale and procedure for videoswallow study to be completed later this date. Son with concerns regarding radiation exposure and ingestion of barium, requesting research-based articles; therapist agrees and provides articles following the session. All questions answered.   Motor Speech Goals   Therapy Activities Articulation;Respiration   Articulation Unstructured conversation   Therapy Activity Comments Intelligibility ~75% in unstructured conversation in quiet listening environment. Patient and son reporting intelligibility is highly dependent on rest/fatigue. Patient reports usefulness of recently-introduced dysarthria strategy of utilizing strong breath support and speaking on exhalation. She is observed to utilize clear speech strategies (I.e., slow rate, over-articulation, speaking on exhalation) throughout conversation with min cues; execution of clear speech strategies especially precise when asked for repetition. Patient also observed to use gestures successfully this date.   Verbal Expression Goals   Therapy Activities Use of an augmentative communication device   Therapy Activity Comments Patient oriented to Tobii Dynavox device and eye-gaze technique. Therapist demonstrates utilizing eye-gaze device to formulate messages, using a variety of features (whiteboard, keyboard, quickfires, etc.). Patient and son demonstrate increasing interest in this, present with questions regarding funding and process to obtain. Therapist provides education regarding best route to obtain device being in outpatient setting, in order to allow sufficient time to trial a variety of devices, customize device, etc. Patient and son acknowledge understanding of education. Patient indicating she is satisfied with utilizing verbal and written language output at this time despite limitations.     Assessment Patient demonstrates interest in eye-gaze device for future use but satisfied with verbal/written language output at this time, despite limitations.    Son with concerns/questions regarding upcoming videoswallow; questions answered and literature provided.   Plan Motor Speech  -Target?respiration in the context of sustained phonation/single words/short phrases  -Read phrases/sentences for strategy use  -Family to bring in voice amplification device  ?  AAC  -Ongoing education of AAC systems/options  -Voice banking of consistently used phrases  -Trial applications on iPad  -Trial applications on pt's smartphone as able (play store is name of pt's app store)  -Ongoing introduction to Tobii Dynavox  -Review text to speech applications?  ?  -consider introducing EMST-Lite to strengthen expiratory muscles for cough, swallow, and phonation  - videoswallow study 10/02/21     Jaquala Fuller MS, CCC-SLP  Voalte [phone]

## 2021-10-02 NOTE — Progress Notes
SPEECH-LANGUAGE PATHOLOGY     10/02/21 1440   Behavior   Comments* Patient seated upright in recliner upon SLP arrival. Patient's son present for duration of therapy session.   Dysphagia Goals   Therapy Activity Comments Provided education regarding videoswallow study completed earlier this date. Video played for Patient and Patient's son. Patient presents with a normal oropharyngeal swallow. No aspiration visualized this date. Patient and Patient's son verbalized understanding to all education provided. Recommend continuation of regular textures and thin liquids with use of safe swallowing strategies. Swallowing recommendations outlined below.    Swallow Recommendations  PO: Regular, Thin liquids  Medications: 1 at a time, As tolerated  Supervision: No supervision needed  Positioning: Up in chair for all meals  Swallow Strategies: Small bites/sips, Slow rate of intake, Alternate liquids/solids  Oral Hygiene: 3 times per day     Assessment Provided education regarding videoswallow study completed earlier this date. Patient and son verbalized understanding to all education provided.    Plan Motor Speech  -Targetrespiration in the context of sustained phonation/single words/short phrases  -Read phrases/sentences for strategy use  -Family to bring in voice amplification device    AAC  -Ongoing education of AAC systems/options  -Voice banking of consistently used phrases  -Trial applications on iPad  -Trial applications on pt's smartphone as able ("play store" is name of pt's app store)  -Ongoing introduction to [employer] Dynavox  -Review text to speech applications    Dysphagia  - monitor tolerance of regular textures/thin liquids  -consider introducing EMST-Lite to strengthen expiratory muscles for cough, swallow, and phonation     Hortencia Conradi, MS, CCC-SLP

## 2021-10-03 ENCOUNTER — Encounter: Admit: 2021-10-03 | Discharge: 2021-10-03 | Payer: MEDICARE | Primary: Family

## 2021-10-03 DIAGNOSIS — G1221 Amyotrophic lateral sclerosis: Secondary | ICD-10-CM

## 2021-10-03 NOTE — Progress Notes
Bedside PFT orders placed for NIV w/u.

## 2021-10-03 NOTE — Progress Notes
PFT dated 10/03/21 results securely forwarded to Jess/Viemed. RN will continue to monitor for NIV orders.

## 2021-10-03 NOTE — Progress Notes
PHYSICAL THERAPY     10/03/21 1000   Type of Note   Type of Note Daily   Time Calculation   Start Time 1000   Stop Time 1100   Time Calculation 60   $$ Gait / Mobility 1 unit    $$ PT Therapeutic Activity 3 units   Subjective   Subjective Patient seated in bedside chair, agreeable to PT and denying therapy. Patient finishing talking to MD about discharge 5/29.   Pain   Pain Scale No Pain   Cognitive   Orientation Oriented x4   Patient Behavior Calm;Cooperative   Family Behavior Supportive   Transfers   Transfer: Hospital doctor   Transfer: Sit to stand assist level Contact guard assistance   Transfer: Sit to stand type of assist For safety   Transfer: Stand to sit assist level Contact guard assistance   Transfer: Stand to sit type of assist For safety   Gait   Gait Distance 20 feet   GAIT: Assist Level Contact guard assistance   Gait Comment Patient ambulating with son in room for family training. Patient's son with appropriate verbal cues.   Stairs   Stair: Assist Level Minimum assistance   Stairs: Number Climbed 8   Stairs Management Technique Non-reciprocal   Stairs: Assistive Device Two Rails;One rail   Stairs Activity Limited By Complaint of fatigue   Stairs Comment Patient completing 4 steps with 2 rails and 4 steps with 1 rail. Patient with increased difficulty with 1 rail. Son present and reporting that handrail at home is round and easier for patient to grip.   Activity   PT Therapeutic Activities Loaner wheelchair in patient's room. PT demonstarting how to hold up, remover legrests, wheels and wheelchair cushion. Patient's son then going through and practicing. Increased time for education and problem solving stair navigation. Patient's stairs are ~12 inches high and patient only has 1 rail at home and the width of the stairs are only large enough to fit her foot. It was decided that patient's son and daughter will bump her up the stairs in the wheelchair. Will practice next session. Patient's son reporting ramp will be made by someone in the family. Education reguarding ALS and activity tolerance/functional ability of patient.   Assessment   Assessment Problem solving stair navigation was discussed with patient's son and patient. Patient's son suggesting putting patient on his back and going up and down stairs. Education on why this method is not safe for patient nor son and PT does NOT reccomend this method. PT reccomending bumping patient up stairs by patient's son and daughter. Will continue to provide education on stair navigation during next session.   Plan   Comments DC QI on 5/28 (patient d/cing 5/29). Daughter will be here Sunday to practice bumping patient up and down on stairs in wheelchair with patient's son (print out handout to give son). Also educate patient's son on navigating stairs with patient going up an down stairs. family training--ALS education, encourage installation of ramp, low-moderate intensity exercise is beneficial, stretching program to address spasticity, stair mobility if they don't get a ramp, have patient's son mobilize patient (walking, transfers, toileting);     Dennie Fetters, PT, DPT

## 2021-10-03 NOTE — Progress Notes
OCCUPATIONAL THERAPY       10/03/21 0900   Time Calculation   Start Time 0900   Stop Time 0945   Time Calculation 45   $$ Phys ADL Skills 3 units   Subjective   Subjective Patient sitting in recliner and agreeable to OT.   Pain   Pain Scale No Pain   Cognitive   Patient Behavior Calm;Cooperative   Family Behavior Supportive   Grooming   Grooming - Air cabin crew No   Grooming - Wash Both Hands Assist No   Grooming Assist Stand By Assist   Grooming Position Standing for ___ % of task  (100)   Bathing   Bath/Shower Soap and water shower/bath   Bathing - Chest Assist No   Bathing - Left Arm Assist No   Bathing - Right Arm Assist No   Bathing - Abdomen Assist No   Bathing - Perineal Area Assist No   Bathing - Buttocks Assist No   Bathing - Left Upper Leg Assist No   Bathing - Right Upper Leg Assist No   Bathing - Left Lower Leg Including Foot Assist No   Bathing - Right Lower Leg Including Foot Assist No   Bathing Assist Stand By Assist   Bathing Position Shower - standing for ___ % tasks  (10)   Biomedical scientist chair with back;Hand held shower;Grab bars - right;Washcloth   Bathing Comments supervision for safety while standing to wash buttocks   Upper Body Dressing   UE Dressing Assist Stand By Assist   Upper Dressing Position Sitting in chair   Upper Dressing Comments setup assist   Lower Body Dressing   LE Dressing Assist Moderate Assist   Lower Dressing Position Sitting in chair   Lower Dressing Comments able to thread BLE into pants and pull over hips with supervision. assist for socks   Toileting   Toileting-Adjusting clothing BEFORE using toiet, commode, bedpan or urinal Assist No   Toileting-Wipe Self Assist No   Toileting-Adjust clothing AFTER using toilet, commode, bedpan or urinal Assist No   Toileting Assist Minimal Assist   Toileting Position Seated   Toileting Comments steadying assist during pants management, pt retropulsive with stand   Toileting Transfer   Toilet Transfer Technique Posterior transfer onto receptacle   Toilet Transfer Assist Minimum assistance   Daily Care   Urine Function Void   Last Bowel Movement Date 10/03/21   Transfers   Transfer: Hospital doctor   Transfer: Sit to stand assist level Contact guard assistance   Transfer: Sit to stand type of assist For safety   Transfer: Stand to sit assist level Contact guard assistance   Transfer: Stand to sit type of assist For safety   Transfer Comment Patient ambulated in room with RW and stand by assist.   Assessment   Assessment Patient progressing toward goals.   Plan   Plan Comments DC QI 5/28, family training with daughter (functional mobility and toileting)   Type of Note   Type of Note Daily            Nicola Girt, OTR/L

## 2021-10-03 NOTE — Telephone Encounter
RN voalte messaged [facility] Fellow requesting call back to discuss BiPAP vs NIV. Dx: ALS. RN received a call from provider. It was noted NIF was - 20 and pt will be in need for NIV.     IPR CM has confirmed [employer] to have BiPAP and cough assist ready for delivery on this day at 3 pm. CM to cancel orders for BiPAP will be DC'd through [employer]. Viemed to w/u NIV STAT.     IPR RT messaged regarding need for bedside PFT. Orders placed. Results to be provided to [facility].     ALS RT team messaged regarding need for NIV orders, STAT.

## 2021-10-03 NOTE — Progress Notes
Message from Sabino Dick - Candace Keith is to be discharged home on Monday 10/06/21.  Discontinue BiPAP.   Patient has ALS and is in need of NIV.     Recommend noninvasive volume ventilator for progressing chest wall weakness, increased respiratory frequency, and ventilatory failure evidenced by accessory muscle use, and c/o SOB.  Bipap not indicated secondary to the requirement for volume ventilator due to increasing pressure requirements with progressive reduction in chest wall compliance and the need for ventilator available 24 hours a day and risk of life threatening hypoventilation.

## 2021-10-03 NOTE — Progress Notes
ATTESTATION    I personally performed the key portions of the E/M visit, discussed case with resident and concur with resident documentation of history, physical exam, assessment, and treatment plan unless otherwise noted.  VFSS per SLP yesterday still on regular diet and thin liquids.  VS reviewed and stable, functional progress as below.  D/w patient, son and SW potential discharge date of 10/06/21 pending DME delivery.  Per Neuromuscular ALS clinic, will change BIPAP to NIV for home.      Staff name:  Harland German, MD Date: 10/03/2021       Physical Medicine & Rehabilitation Progress Note       Today's Date:  10/03/2021  Admission Date: 09/26/2021  LOS: 7 days  Insurance: [facility] MEDICARE    Principal Problem:    ALS (amyotrophic lateral sclerosis) (HCC)  Active Problems:    Essential hypertension    Mitral valve insufficiency    Declining functional status    Severe malnutrition (HCC)    Risk for falls    Impaired mobility and activities of daily living    Dysarthria                       Assessment/Plan:     Candace Keith is a 81 y.o.  female admitted [facility] on 09/26/2021 with the following issues: ALS    Rehabilitation Plan  Tentative discharge date: 10/06/2021  Rehabilitation: Patient will continue with comprehensive therapies including physical therapy, occupational therapy, speech & language pathology, specialized rehab nursing, neuropsychology and physiatry oversight.    Goals: at ambulation level, household mobility, With caregiver assistance, Progressing  Recommended therapy after discharge: Home with Assistance, and, Home Health Setting  PT recommended equipment: Wheelchair - Manual, Wheelchair cushion (ramp)  OT recommended equipment: Transport planner, Commode, Grab bar(s), Shower hose    Daily Functional Update:  Transfers        Transfer: Assistive Device: Nurse, adult (10/02/2021  9:30 AM)    Transfer: Sit to stand assist level: Contact guard assistance (10/02/2021  9:30 AM)    Transfer: Stand pivot assist level: Contact guard assistance (10/02/2021  8:00 AM)    No data recorded   Gait/ Mobility        Gait: Assistive Device: Nurse, adult; Wheelchair Follow (therapist pulled wheelchair behind patient) (10/02/2021  9:30 AM)    GAIT: Assist Level: Contact guard assistance (10/02/2021  9:30 AM)    Gait Distance: 150 feet (10/02/2021  9:30 AM)    No data recorded  No data recorded   Toileting        Toileting Assist: Minimal Assist (10/02/2021  9:30 AM)    No data recorded  Toilet Transfer Assist: Minimum assistance (10/02/2021  9:30 AM)     Dressing LE Dressing Assist: Moderate Assist (09/29/2021 11:00 AM)    UE Dressing Assist: Stand By Assist (09/29/2021 11:00 AM)         Current Medical Problems/Risks of Medical Complications/Management    ALS  Tetraparesis, Dysarthria/bulbar weakness, Oropharyngeal dysphagia  Fasciculations, hyperreflexia  Fall Risk  Impaired mobility and ADL  - Riluzole has been discussed with patient and son wants to hold on medication addition   - PT/OT/SLP to eval and treat   - regular diet / thin liquids per SLP assessment 5/22  - Outpatient referral for ALS clinic when discharged (requested by neuro)  - Neuromuscular consulted 5/23 and will establish in outpatient ALS clinic  -  Neuropsych to eval and treat  ?  Chronic respiratory failure  Neuromuscular restrictive lung disease  Sleep disturbance  - BIPAP at night in the hospital, plan for NIV at home per Neurology  - cough assist machine during the day prn, suction prn  - added melatonin 1mg  QHS prn 5/22  - sleep bundle ordered  - patient will require indefinite use of noninvasive ventilation at night, cough assist and suction as needed, due to progressive neuromuscular disease and chronic respiratory failure   > completed overnight pulse oximetry and ABG    HTN  - continue amlodipine 2.5mg  daily   ?  Hypothyroidism   - discontinued PTA synthroid (patient refusing, will d/c per patient request)  - Free T4 09/22/21 0.8-wnl, TSH 13.79 elevated  ?  Mitral regurgitation   - follows with Dr. Geronimo Boot with [place] cardiology  ?  Left breast mass  - f/u with oncologist outpatient  ?  Malnutrition Assessment  Malnutrition present on admission  ICD-10 code E43: Chronic illness/Severe malnutrition  ??????Weight loss: >?5% x 1 month, Energy intake: 75% or less of estimated energy requirement for 1 month or more ?  Malnutrition Interventions: Small frequent meals/snacks q2-3 hrs, 6 total. Meals BID + 1/2 carton of [employer] 1.4 or 1.0, 4x per day.  Our dietician will continue to follow.  ?  Pain Management: pain is well controlled, heat / ice PRN, topical medication and Tylenol PRN  ?  Skin: There are no pressure sores currently. and Encourage the patient to continue to inspect their skin and perform pressure relief.  ?  Bowel: continent of bowel  ?  Bladder: continent of bladder, voiding with negative admission BVI x3  ?  BMI: 18.55kg/m2  - Consult dietician for concerns of malnutrition.   ?  Mental Health: consult neuropsychology to provide support / counseling    DVT Prophylaxis: enoxaparin (LOVENOX) syringe 40 mg  QDAY(21)     Code status:  DNAR-FI    Delmer Islam PGY-3  Physical Medicine and Rehabilitation Resident  On Voalte, Pager [phone]    Subjective     NAEO. Denies any acute concerns.  Daughter coming from [place] this weekend for family training.    Objective                          Vital Signs: Last Filed                 Vital Signs: 24 Hour Range   BP: 155/76 (05/26 0615)  Temp: 36.3 ?C (97.4 ?F) (05/26 8469)  Pulse: 64 (05/26 0615)  Respirations: 18 PER MINUTE (05/26 0615)  SpO2: 98 % (05/26 0615)  O2 Device: None (Room air) (05/26 0615) BP: (128-155)/(70-76)   Temp:  [36.3 ?C (97.4 ?F)-36.6 ?C (97.9 ?F)]   Pulse:  [64-68]   Respirations:  [16 PER MINUTE-18 PER MINUTE]   SpO2:  [97 %-98 %]   O2 Device: None (Room air)     Vitals:    09/26/21 1311 09/29/21 0440   Weight: 49 kg (108 lb 0.4 oz) 48.7 kg (107 lb 5.8 oz)       Intake/Output Summary:  (Last 24 hours)  No intake or output data in the 24 hours ending 10/03/21 0751     Last Bowel Movement Date: 10/02/21    Labs--reviewed  No results found for this visit on 09/26/21 (from the past 24 hour(s)).    Medications:  Scheduled Meds:amLODIPine (NORVASC) tablet  2.5 mg, 2.5 mg, Oral, QDAY  enoxaparin (LOVENOX) syringe 40 mg, 40 mg, Subcutaneous, QDAY(21)  Protandim NAD Synergizer (Patient's Own Medication), 1 Dose, Oral, QDAY  Protandim NRF1 Synergizer (Patient's Own Medication), 1 Dose, Oral, QDAY  Protandim NRF2 Synergizer (Patient's Own Medication), 1 Dose, Oral, QDAY    Continuous Infusions:  PRN and Respiratory Meds:acetaminophen Q4H PRN, aluminum/magnesium hydroxide Q4H PRN, other medication PRN, bisacodyL QDAY PRN, melatonin QHS PRN, ondansetron Q6H PRN       Physical Exam  General: Alert, conversant, NAD, seen sitting in chair in her room  HEENT: NCAT, oral mucosa moist   Heart: Extremities appear well perfused  Lungs: non labored breathing. Symmetrical chest expansion  Abdomen: non-distended  Extremities: no pedal edema  Neuro: Alert, follows commands. Appropriate processing time with conversation. Moves all limbs spontaneously, hypophonic and slow speech  Psych: pleasant mood, appropriate affect    Labs & Therapy Notes Reviewed    Delmer Islam, DO

## 2021-10-03 NOTE — Progress Notes
SPEECH-LANGUAGE PATHOLOGY  REHAB SPEECH DAILY NOTE      10/03/21 1500   Behavior   Comments* Pt seated upright in recliner, alert. Pt pleasant participatory for all tasks. Pt's son additionally present.   Motor Speech Goals   Therapy Activities Respiration   Articulation Oral reading - sentences;Minimal prompting - patient able to 75-90% of the time   Respiration Breathing exercises;Minimal prompting - patient able to 50-74% of the time   Therapy Activity Comments SLP provided education re: implementation of EMST Lite to address cough strength, prophylactic pharyngeal strengthing, and respiratory support for speech. Pt able to return demonstration of effortful breath through straw, thus deemed appropriate candidate for EMST. Max expiratory pressure noted at 8ccH20; valve set to lowest seeting of 5ccH20. Reviewed procedure with patient and son; handout provided re: 5x5x5 exercise program. All parties verbalized understanding. Rote phrases developed for future word/phrase banking. Each completed x5 repetitions requiring mod cues/visual feedback to achieve ~80% intelligibility. Further education completed with pt/family re: use of recording device on patient's cellphone in order to complete voice banking in home environment upon d/c.     Assessment Education provided re: EMST, use of cellphone for voice banking. Pt progressing towards goals.    Plan Motor Speech  -Target?respiration in the context of sustained phonation/single words/short phrases  -Read phrases/sentences for strategy use  -Family to bring in voice amplification device  ?  AAC  -Ongoing education of AAC systems/options  -Voice banking of consistently used phrases  -Trial applications on iPad  -Trial applications on pt's smartphone as able (play store is name of pt's app store)  -Ongoing introduction to [employer] Dynavox  -Review text to speech applications  ?  Dysphagia  - monitor tolerance of regular textures/thin liquids  -EMST-Lite to strengthen expiratory muscles for cough, swallow, and phonation (set currently to 5ccH20)     Therapist: Silas Flood, MA, CCC-SLP Voalte: [phone]  Date: 10/03/2021

## 2021-10-03 NOTE — Case Management (ED)
Case Management Progress Note    NAME:Candace Keith                          MRN: [id number]              DOB:November 17, 1940          AGE: 81 y.o.  ADMISSION DATE: 09/26/2021             DAYS ADMITTED: LOS: 7 days      Today's Date: 10/03/2021    PLAN:     Discharge to home with son providing support and [facility] PT, OT and ST.    [employer] delivered drop arm BSC.    [employer] is providing the NIV, Cough Assist and suctioning. They will meet pt and son at pt's home on Monday afternoon for delivery and education.    Expected Discharge Date: 10/06/2021   Is Patient Medically Stable: No, Please explain: Rehab goals  Are there Barriers to Discharge? no    INTERVENTION/DISPOSITION:  ? Discharge Planning                  ? SW in communication with ALS clinic, Candace Keith. After further discussion with ALS physicians, decision was to cancel order for BiPAP and work on order of NIV.   ? SW cancelled order for BiPAP, cough assist and suctioning with [employer] to pursue order for NIV.  ? SW worked together with Candace and Candace Keith from [employer]. Provided Candace Keith appropriate documentation and was able to provide paperwork signed by Rehab physician to [employer]. Candace was able to coordinate to get the PFT's and final orders from ALS clinic.  ? SW communicated with Rehab physician and provided update.   ? SW, along with Candace Keith, from [place] [facility], met with pt and son at bedside. Provided update on a not using the BiPAP at home, but using NIV, and that Candace Keith would deliver the NIV, cough assist and suctioning to home and do the teach with them on Monday morning. They were all agreeable.  ? SW cancelled order to [employer].  ? SW received email from pt's daughter, Candace Keith. She will be present on Sunday from 9-11 for training. SW updated therapy to do the scheduling. SW met with pt and Candace Keith to udpate. SW also met individually with pt about this training with Candace Keith. Pt is agreeable to this. Pt upset over Candace Keith and [facility]ace [facility] fighting over the phone. She just wishes they could all get along. SW also discussed this with Candace Keith and his mothers wishes.  ? SW updated pt's daughter, Candace Keith and Candace Keith, with pt's permission of the approval of the family training on Sunday and discharge date of Monday.  ? Candace Keith is asking for medical records. SW explained he would have to go through medical records as pt does not want to set up my chart. Pt continues not to want to use MyChart.  ? SW notified [facility] of discharge on 10/06/2021. SW sent orders to physician for signature and added to weekend report.   ? Transportation                 ? Support                 ? Info or Referral                 ? Positive SDOH Domains and Potential Barriers                   ?  Medication Needs                                                                                                                                                         ? Financial                 ? Legal                 ? Other                 ? Discharge Disposition                                                                                                                                                      Selected Continued Care - Admitted Since 09/26/2021     [facility] Coordination complete.    Service Provider Selected Services Address Phone Fax Patient Preferred    [facility] AT [facility] [address], [place] [address] [phone] --                  Julious Oka, LMSW   Available on voalte  [phone]

## 2021-10-03 NOTE — Progress Notes
OCCUPATIONAL THERAPY       10/03/21 1315   Time Calculation   Start Time 1315   Stop Time 1345   Time Calculation 30   $$ Professional Contact 1 unit   $$ Phys ADL Skills 2 units   Subjective   Subjective Patient sitting in recliner and agreeable to OT.   Transfers   Transfer: Product/process development scientist: Sit to stand assist level Contact guard assistance   Transfer: Sit to stand type of assist For safety   Transfer: Stand to sit assist level Contact guard assistance   Transfer: Stand to sit type of assist For safety   Transfer Comment Patient ambulated to therapy gym with RW and contact guard assist.   Activity   Therapeutic Activities Patient stood at elevated mat x6 minutes while playing novel card game with supervision for safety.   Assessment   Assessment Patient progressing toward goals.   Plan   Plan Comments training with daughter (functional mobility and toileting)   Recommendations   OT Equipment Recommendations Bath tub bench;Commode;Grab bar(s);Shower hose   Comments Emailed family links to all equipment.   Goals for the stay   Pt will perform basic care and transfer with Minimum assistance;Roller walker level   Type of Note   Type of Note Daily     Nicola Girt, OTR/L

## 2021-10-04 NOTE — Progress Notes
NIV orders securely forwarded to Jess/RT [facility].

## 2021-10-04 NOTE — Progress Notes
Physical Medicine & Rehabilitation Progress Note       Today's Date:  10/04/2021  Admission Date: 09/26/2021  LOS: 8 days  Insurance: [facility] MEDICARE    Principal Problem:    ALS (amyotrophic lateral sclerosis) (HCC)  Active Problems:    Essential hypertension    Mitral valve insufficiency    Declining functional status    Severe malnutrition (HCC)    Risk for falls    Impaired mobility and activities of daily living    Dysarthria                       Assessment/Plan:     Candace Keith is a 81 y.o.  female admitted to [facility] on 09/26/2021 with the following issues: ALS    Rehabilitation Plan  Tentative discharge date: 10/06/2021  Rehabilitation: Patient will continue with comprehensive therapies including physical therapy, occupational therapy, speech & language pathology, specialized rehab nursing, neuropsychology and physiatry oversight.    Goals: at ambulation level, household mobility, With caregiver assistance, Progressing  Recommended therapy after discharge: Home with Assistance, and, Home Health Setting  PT recommended equipment: Wheelchair - Manual, Wheelchair cushion (ramp)  OT recommended equipment: Transport planner, Commode, Grab bar(s), Shower hose    Daily Functional Update:  Transfers        Transfer: Assistive Device: Nurse, adult (10/03/2021  1:15 PM)    Transfer: Sit to stand assist level: Contact guard assistance (10/03/2021  1:15 PM)    Transfer: Stand pivot assist level: Contact guard assistance (10/02/2021  8:00 AM)    No data recorded   Gait/ Mobility        Gait: Assistive Device: Nurse, adult; Wheelchair Follow (therapist pulled wheelchair behind patient) (10/02/2021  9:30 AM)    GAIT: Assist Level: Contact guard assistance (10/03/2021 10:00 AM)    Gait Distance: 20 feet (10/03/2021 10:00 AM)    No data recorded  No data recorded   Toileting        Toileting Assist: Minimal Assist (10/03/2021  9:00 AM)    No data recorded  Toilet Transfer Assist: Minimum assistance (10/03/2021  9:00 AM)     Dressing LE Dressing Assist: Moderate Assist (10/03/2021  9:00 AM)    UE Dressing Assist: Stand By Assist (10/03/2021  9:00 AM)         Current Medical Problems/Risks of Medical Complications/Management    ALS  Tetraparesis, Dysarthria/bulbar weakness, Oropharyngeal dysphagia  Fasciculations, hyperreflexia  Fall Risk  Impaired mobility and ADL  - Riluzole has been discussed with patient and son wants to hold on medication addition   - PT/OT/SLP to eval and treat   - regular diet / thin liquids per SLP assessment 5/22  - Outpatient referral for ALS clinic when discharged (requested by neuro)  - Neuromuscular consulted 5/23 and will establish in outpatient ALS clinic  - Neuropsych to eval and treat  ?  Chronic respiratory failure  Neuromuscular restrictive lung disease  Sleep disturbance  - BIPAP at night in the hospital, plan for NIV at home per Neurology  - cough assist machine during the day prn, suction prn  - added melatonin 1mg  QHS prn 5/22  - sleep bundle ordered  - patient will require indefinite use of noninvasive ventilation at night, cough assist and suction as needed, due to progressive neuromuscular disease and chronic respiratory failure   > completed overnight pulse oximetry and ABG    HTN  - continue  amlodipine 2.5mg  daily   ?  Hypothyroidism   - discontinued PTA synthroid (patient refusing, will d/c per patient request)  - Free T4 09/22/21 0.8-wnl, TSH 13.79 elevated  ?  Mitral regurgitation   - follows with Dr. Geronimo Boot with [facility]  ?  Left breast mass  - f/u with oncologist outpatient  ?  Malnutrition Assessment  Malnutrition present on admission  ICD-10 code E43: Chronic illness/Severe malnutrition  ??????Weight loss: >?5% x 1 month, Energy intake: 75% or less of estimated energy requirement for 1 month or more ?  Malnutrition Interventions: Small frequent meals/snacks q2-3 hrs, 6 total. Meals BID + 1/2 carton of kate farms 1.4 or 1.0, 4x per day.  Our dietician will continue to follow.  ?  Pain Management: pain is well controlled, heat / ice PRN, topical medication and Tylenol PRN  ?  Skin: There are no pressure sores currently. and Encourage the patient to continue to inspect their skin and perform pressure relief.  ?  Bowel: continent of bowel  ?  Bladder: continent of bladder, voiding with negative admission BVI x3  ?  BMI: 18.55kg/m2  - Consult dietician for concerns of malnutrition.   ?  Mental Health: consult neuropsychology to provide support / counseling    DVT Prophylaxis: enoxaparin (LOVENOX) syringe 40 mg  QDAY(21)     Code status:  DNAR-FI    Subjective     No acute events overnight. Patient seen eating breakfast without acute complaints. Therapy notes reviewed, and patient is progressing well. Patient is continent of bowel and bladder. Denies fever, chills, headache, chest pain, shortness of breath, nausea, dysuria, or diarrhea.    Objective                          Vital Signs: Last Filed                 Vital Signs: 24 Hour Range   BP: 150/91 (05/27 0545)  Temp: 36.6 ?C (97.9 ?F) (05/27 0545)  Pulse: 62 (05/27 0545)  Respirations: 18 PER MINUTE (05/27 0545)  SpO2: 94 % (05/27 0545)  O2 Device: None (Room air) (05/27 0545) BP: (148-150)/(91)   Temp:  [36.6 ?C (97.9 ?F)-36.8 ?C (98.3 ?F)]   Pulse:  [62-69]   Respirations:  [18 PER MINUTE]   SpO2:  [94 %-97 %]   O2 Device: None (Room air)     Vitals:    09/26/21 1311 09/29/21 0440   Weight: 49 kg (108 lb 0.4 oz) 48.7 kg (107 lb 5.8 oz)       Intake/Output Summary:  (Last 24 hours)  No intake or output data in the 24 hours ending 10/04/21 1024     Last Bowel Movement Date: 10/03/21    Labs--reviewed  No results found for this visit on 09/26/21 (from the past 24 hour(s)).    Medications:  Scheduled Meds:amLODIPine (NORVASC) tablet 2.5 mg, 2.5 mg, Oral, QDAY  enoxaparin (LOVENOX) syringe 40 mg, 40 mg, Subcutaneous, QDAY(21)  Protandim NAD Synergizer (Patient's Own Medication), 1 Dose, Oral, QDAY  Protandim NRF1 Synergizer (Patient's Own Medication), 1 Dose, Oral, QDAY  Protandim NRF2 Synergizer (Patient's Own Medication), 1 Dose, Oral, QDAY    Continuous Infusions:  PRN and Respiratory Meds:acetaminophen Q4H PRN, aluminum/magnesium hydroxide Q4H PRN, other medication PRN, bisacodyL QDAY PRN, melatonin QHS PRN, ondansetron Q6H PRN       Physical Exam  General: sitting up in bedside chair, NAD  HEENT: NCAT,  oral mucosa moist   Heart: Extremities appear well perfused  Lungs: non labored breathing. Symmetrical chest expansion  Abdomen: non-distended  Extremities: no pedal edema  Neuro: Alert, follows commands. Appropriate processing time with conversation. Moves all limbs spontaneously, hypophonic and slow speech  Psych: pleasant mood, appropriate affect    Labs & Therapy Notes Reviewed    I personally observed the resident performing the E/M and discussed case with resident.    Aurelio Brash MD  Interventional Spine & MSK Medicine Fellow  PM&R

## 2021-10-04 NOTE — Progress Notes
NIV orders pending. RN co-signed to NM fellow.

## 2021-10-05 MED ORDER — OTHER MEDICATION
RESPIRATORY_TRACT | 0 refills | Status: AC
Start: 2021-10-05 — End: ?

## 2021-10-05 NOTE — Progress Notes
PHYSICAL THERAPY     10/05/21 0900   Type of Note   Type of Note Daily   Time Calculation   Start Time 0900   Stop Time 1000   Time Calculation 60   $$ Professional Contact 1 unit    $$ Gait / Mobility 2 units    $$ PT Therapeutic Activity 1 unit   $$ Wh/chr Mngt/Mob 1 unit    Subjective   Subjective Patient is agreeable to therapy session.  Son present towards end of session   Transfers   Transfer: Hospital doctor   Transfer: Sit to stand assist level Stand by assistance   Transfer: Sit to stand type of assist For safety   Transfer: Stand to sit assist level Stand by assistance   Transfer: Stand to sit type of assist For safety   Transfer: Stand pivot assist level Stand by assistance   Transfer: Stand pivot type of assist For safety   Gait   Gait Distance 150 feet  (150)   GAIT: Assist Level Stand by assistance   GAIT: Type of Assist For safety   Gait: Assistive Device Roller Walker   Gait: Patterns Shuffling;Trunk lean forward   Gait: Deviation Left Decreased stride length   Gait: Deviation Right Decreased stride length   Activity Limited By Complaint of fatigue   Stairs   Stair: Assist Level Contact guard assistance   Stairs: Number Climbed 8   Stairs Management Technique Reciprocal   Stairs: Assistive Device Two Rails;One rail   Stairs Activity Limited By Complaint of fatigue   Stairs Comment Discussed with son having two people to assist with stairs.  Handout on wheelchair bump up was also given to son.   Activity   PT Therapeutic Activities Discussed patient's mobility with son.  Went over features of the wheelchair for bumping chair up stairs.   NuStep 13-15 Minutes;Level 2  (Patient requested to use the NuStep)   Assessment   Assessment Patient is able to get up the stairs with min/CGA.  Patient's son was instructed on how to assist with two or one person.  So discussed the need to get to ALS clinic and the travel from home.  Son was ensured they have people come from out of town for assistance and will know how to work with long distance patients.   Weekly Goals   Patient will perform sit to supine with Minimum assistance;Achieved   Patient will perform supine to sit with Minimum assistance;Achieved   Patient will complete sit to stand transfer with Stand by assistance;Achieved   Patient will complete stand to sit transfer with Stand by assistance;Achieved   Patient will complete stand pivot transfer with Stand by assistance;Achieved   Patient will ambulate 150 feet;Least assistive device;Stand by assistance;Achieved   Patient will ascend/descend 4 stairs;Minimum assistance;1 rail;Achieved   Goal(s) for the Stay   Patient will perform at ambulation level;household mobility;Independent;Adequate for discharge       Pam Drown PT, DPT

## 2021-10-05 NOTE — Progress Notes
Physical Medicine & Rehabilitation Progress Note       Today's Date:  10/05/2021  Admission Date: 09/26/2021  LOS: 9 days  Insurance: [facility] MEDICARE    Principal Problem:    ALS (amyotrophic lateral sclerosis) (HCC)  Active Problems:    Essential hypertension    Mitral valve insufficiency    Declining functional status    Severe malnutrition (HCC)    Risk for falls    Impaired mobility and activities of daily living    Dysarthria                       Assessment/Plan:     Candace Keith is a 81 y.o.  female admitted to [facility] on 09/26/2021 with the following issues: ALS    Rehabilitation Plan  Tentative discharge date: 10/06/2021  Rehabilitation: Patient will continue with comprehensive therapies including physical therapy, occupational therapy, speech & language pathology, specialized rehab nursing, neuropsychology and physiatry oversight.    Goals: at ambulation level, household mobility, With caregiver assistance, Progressing  Recommended therapy after discharge: Home with Assistance, and, Home Health Setting  PT recommended equipment: Wheelchair - Manual, Wheelchair cushion (ramp)  OT recommended equipment: Transport planner, Commode, Grab bar(s), Shower hose    Daily Functional Update:  Transfers        Transfer: Assistive Device: Nurse, adult (10/03/2021  1:15 PM)    Transfer: Sit to stand assist level: Contact guard assistance (10/03/2021  1:15 PM)    Transfer: Stand pivot assist level: Contact guard assistance (10/02/2021  8:00 AM)    No data recorded   Gait/ Mobility        Gait: Assistive Device: Nurse, adult; Wheelchair Follow (therapist pulled wheelchair behind patient) (10/02/2021  9:30 AM)    GAIT: Assist Level: Contact guard assistance (10/03/2021 10:00 AM)    Gait Distance: 20 feet (10/03/2021 10:00 AM)    No data recorded  No data recorded   Toileting        Toileting Assist: Minimal Assist (10/03/2021  9:00 AM)    No data recorded  Toilet Transfer Assist: Minimum assistance (10/03/2021  9:00 AM)     Dressing LE Dressing Assist: Moderate Assist (10/03/2021  9:00 AM)    UE Dressing Assist: Stand By Assist (10/03/2021  9:00 AM)         Current Medical Problems/Risks of Medical Complications/Management    ALS  Tetraparesis, Dysarthria/bulbar weakness, Oropharyngeal dysphagia  Fasciculations, hyperreflexia  Fall Risk  Impaired mobility and ADL  - Riluzole has been discussed with patient and son wants to hold on medication addition   - PT/OT/SLP to eval and treat   - regular diet / thin liquids per SLP assessment 5/22  - Outpatient referral for ALS clinic when discharged (requested by neuro)  - Neuromuscular consulted 5/23 and will establish in outpatient ALS clinic  - Neuropsych to eval and treat  ?  Chronic respiratory failure  Neuromuscular restrictive lung disease  Sleep disturbance  - BIPAP at night in the hospital, plan for NIV at home per Neurology  - cough assist machine during the day prn, suction prn  - added melatonin 1mg  QHS prn 5/22  - sleep bundle ordered  - patient will require indefinite use of noninvasive ventilation at night, cough assist and suction as needed, due to progressive neuromuscular disease and chronic respiratory failure   > completed overnight pulse oximetry and ABG    HTN  - continue  amlodipine 2.5mg  daily   ?  Hypothyroidism   - discontinued PTA synthroid (patient refusing, will d/c per patient request)  - Free T4 09/22/21 0.8-wnl, TSH 13.79 elevated  ?  Mitral regurgitation   - follows with Dr. Geronimo Boot with [facility]  ?  Left breast mass  - f/u with oncologist outpatient  ?  Malnutrition Assessment  Malnutrition present on admission  ICD-10 code E43: Chronic illness/Severe malnutrition  ??????Weight loss: >?5% x 1 month, Energy intake: 75% or less of estimated energy requirement for 1 month or more ?  Malnutrition Interventions: Small frequent meals/snacks q2-3 hrs, 6 total. Meals BID + 1/2 carton of [employer] 1.4 or 1.0, 4x per day.  Our dietician will continue to follow.  ?  Pain Management: pain is well controlled, heat / ice PRN, topical medication and Tylenol PRN  ?  Skin: There are no pressure sores currently. and Encourage the patient to continue to inspect their skin and perform pressure relief.  ?  Bowel: continent of bowel  ?  Bladder: continent of bladder, voiding with negative admission BVI x3  ?  BMI: 18.55kg/m2  - Consult dietician for concerns of malnutrition.   ?  Mental Health: consult neuropsychology to provide support / counseling    DVT Prophylaxis: enoxaparin (LOVENOX) syringe 40 mg  QDAY(21)     Code status:  DNAR-FI    Subjective     No acute events overnight. Patient seen in good spirits working with therapy. Therapy notes reviewed, and patient is progressing well. Patient is continent of bowel and bladder. Denies fever, chills, headache, chest pain, shortness of breath, nausea, dysuria, or diarrhea.    Objective                          Vital Signs: Last Filed                 Vital Signs: 24 Hour Range   BP: 159/91 (05/28 0517)  Temp: 36.5 ?C (97.7 ?F) (05/28 5409)  Pulse: 62 (05/28 0517)  Respirations: 16 PER MINUTE (05/28 0517)  SpO2: 96 % (05/28 0517)  O2 Device: None (Room air) (05/28 0517) BP: (131-159)/(79-91)   Temp:  [36.5 ?C (97.7 ?F)-36.6 ?C (97.8 ?F)]   Pulse:  [62-68]   Respirations:  [16 PER MINUTE]   SpO2:  [96 %-98 %]   O2 Device: None (Room air)     Vitals:    09/26/21 1311 09/29/21 0440   Weight: 49 kg (108 lb 0.4 oz) 48.7 kg (107 lb 5.8 oz)       Intake/Output Summary:  (Last 24 hours)    Intake/Output Summary (Last 24 hours) at 10/05/2021 8119  Last data filed at 10/04/2021 1200  Gross per 24 hour   Intake 100 ml   Output --   Net 100 ml        Last Bowel Movement Date: 10/05/21    Labs--reviewed  No results found for this visit on 09/26/21 (from the past 24 hour(s)).    Medications:  Scheduled Meds:amLODIPine (NORVASC) tablet 2.5 mg, 2.5 mg, Oral, QDAY  enoxaparin (LOVENOX) syringe 40 mg, 40 mg, Subcutaneous, QDAY(21)  Protandim NAD Synergizer (Patient's Own Medication), 1 Dose, Oral, QDAY  Protandim NRF1 Synergizer (Patient's Own Medication), 1 Dose, Oral, QDAY  Protandim NRF2 Synergizer (Patient's Own Medication), 1 Dose, Oral, QDAY    Continuous Infusions:  PRN and Respiratory Meds:acetaminophen Q4H PRN, other medication PRN, bisacodyL QDAY PRN  Physical Exam  General: sitting up in stationary bike, NAD  HEENT: NCAT, oral mucosa moist   Heart: Extremities appear well perfused  Lungs: non labored breathing. Symmetrical chest expansion  Abdomen: non-distended  Extremities: no pedal edema  Neuro: Alert, follows commands. Appropriate processing time with conversation. Moves all limbs spontaneously, hypophonic and slow speech  Psych: pleasant mood, appropriate affect    Labs & Therapy Notes Reviewed      I personally observed the resident performing the E/M and discussed case with resident.    Aurelio Brash MD  Interventional Spine & MSK Medicine Fellow  PM&R

## 2021-10-05 NOTE — Discharge Instructions - Pharmacy
Discharge Summary      Name: Candace Keith  Medical Record Number: [id number]        Account Number:  [id number]  Date Of Birth:  [birth year]                         Age:  81 y.o.   Admit date:  09/26/2021                     Discharge date:  10/06/2021      Discharge Attending:  Aurelio Brash, MD  Discharge Summary Completed By: Aurelio Brash, MD    Service: Rehab Medicine    Reason for hospitalization:  ALS (amyotrophic lateral sclerosis) (HCC) [G12.21]    Primary Discharge Diagnosis:   ALS (amyotrophic lateral sclerosis) [facility])      Hospital Diagnoses:  Hospital Problems        Active Problems    * (Principal) ALS (amyotrophic lateral sclerosis) (HCC)    Essential hypertension    Mitral valve insufficiency    Declining functional status    Severe malnutrition (HCC)    Risk for falls    Impaired mobility and activities of daily living    Dysarthria     Significant Past Medical History        Essential hypertension  Mitral valve insufficiency    Allergies   Patient has no known allergies.    Brief Hospital Course   The patient was admitted and the following issues were addressed during this hospitalization:     Candace Keith 45F?admitted 09/22/2021 with chief complaint of generalized weakness, debility, failure to thrive, speech changes, and?frequent falls. Patient was brought to the [facility] by her son. Neurology was consulted. ?No evidence of stroke. ?Ongoing work-up for neuromuscular disorder, concern for ALS.??EMG completed - results consistent with ALS.?Son wanted to hold on Riluzole.   ?  Patient admitted to [facility] 09/26/2021. NIV, cough assist and suction at home per neurology. We discontinued PTA synthroid per patient request. SLP w/ regular diet. Remainder of course unremarkable. Patient was hemodynamically stable on day of discharge.    Rehab QI:   Evaluation QI Current QI   Transfer: Sit to stand assist level: Moderate assistance, Minimum assistance (moderate assist from bed)  Gait Distance: 150 feet (150, 50, 20)  GAIT: Assist Level: Minimum assistance  Gait: Assistive Device: Nurse, adult        Stair: Assist Level: Minimum assistance     Grooming Assist: Minimal Assist  Bathing Assist: Minimal Assist  UE Dressing Assist: Stand By Assist  LE Dressing Assist: Maximum Assist  Toileting Assist: Moderate Assist  Toilet Transfer Assist: Facilitation of trunk, For safety, Minimum assistance  Shower Transfer Assist: Minimum assistance  Patient continent of bladder?: Yes, timed void  Patient continent of bowel?: Yes, no bowel program                Transfer: Sit to stand assist level: Stand by assistance  Gait Distance: 150 feet (150)  GAIT: Assist Level: Stand by assistance  Gait: Assistive Device: Nurse, adult        Stair: Assist Level: Contact guard assistance     Grooming Assist: Stand By Assist  Bathing Assist: Stand By Assist  UE Dressing Assist: Stand By Assist  LE Dressing Assist: Moderate Assist  Toileting Assist: Minimal Assist  Toilet Transfer Assist: Minimum assistance  Shower Transfer Assist: Minimum assistance  Patient continent of bladder?:  Yes, void  Patient continent of bowel?: Yes, no bowel program                    PT recommended equipment: Wheelchair - Manual, Wheelchair cushion (ramp)   OT recommended equipment: Transport planner, Commode, Grab bar(s), Shower hose         Items Needing Follow Up   Pending items or areas that need to be addressed at follow up:     - Neuromuscular consulted 5/23 and will establish in outpatient ALS clinic  - Left breast mass f/u with oncologist outpatient    Pending Labs and Follow Up Radiology    Pending labs and/or radiology review at this time of discharge are listed below: if this area is blank, there are no items for review.       Medications      Medication List      CONTINUE taking these medications    ? amLODIPine 2.5 mg tablet; Commonly known as: NORVASC; Dose: 2.5 mg; Take   one tablet by mouth daily.; Quantity: 90 tablet; Refills: 0  ? GINKOBA PO; Dose: 1 tablet; Refills: 0  ? LUTEIN PO; Refills: 0  ? * other medication; Dose: 1 Dose; Refills: 0  ? * other medication; Dose: 1 Dose; Refills: 0  ? * other medication; Dose: 1 Dose; Refills: 0  ? * other medication; Medication Name & Strength: AMPED Hydrate - Use PRN;   Quantity: 1 Dose; Refills: 0  * This list has 4 medication(s) that are the same as other medications   prescribed for you. Read the directions carefully, and ask your doctor or   other care provider to review them with you.       Return Appointments and Scheduled Appointments     Scheduled appointments:    Jan 05, 2022  2:00 PM  Office visit with Laurence Aly, MD  Cardiology: Mendel Ryder (CVM Exam) [phone] [place].  [address] [place] [address]  [phone]        Contact information for after-discharge care            [facility]     [facility]    Phone: [phone]    Fax: [phone]    Where: [address], [place] [phone]    Service: Home Health Services                Consults, Procedures, Diagnostics, Micro, Pathology   Consults: Neurology  Surgical Procedures & Dates: None  Significant Diagnostic Studies, Micro and Procedures: noted in brief hospital course  Significant Pathology: none  Nutrition:  Malnutrition present on admission  ICD-10 code E43: Chronic illness/Severe malnutrition        Weight loss: > 5% x 1 month, Energy intake: 75% or less of estimated energy requirement for 1 month or more  Loss of Subcutaneous Fat: Yes Moderate Orbital  Muscle Wasting: Yes Moderate Temple, Clavicle  Edema: No            Malnutrition Interventions: Small frequent meals/snacks q2-3 hrs, 6 total. Meals BID + 1/2 carton of kate farms 1.4 or 1.0, 4x per day.                                Discharge Disposition, Condition   Patient Disposition: Home Health Care Svc [06]  Condition at Discharge: Stable    Code Status  Code Status History     Date Active Date Inactive Code Status Order ID 09/26/2021 [id number] 09/30/2021 1057 Full Code [id number]  Francesco Sor, DO Inpatient    09/26/2021 1252 09/26/2021 1513 DNAR-Full Intervention [id number]  Jamal Maes, DO Inpatient    Only showing the last 2 code statuses.          Patient Instructions     Activity     Activity as Tolerated   As directed      It is important to keep increasing your activity level after you leave the hospital.  Moving around can help prevent blood clots, lung infection (pneumonia) and other problems.  Gradually increasing the number of times you are up moving around will help you return to your normal activity level more quickly.  Continue to increase the number of times you are up to the chair and walking daily to return to your normal activity level. Begin to work toward your normal activity level at discharge      Diet     Regular Diet   As directed      You have no dietary restriction. Please continue with a healthy balanced diet.      Scheduled appointments:    Jan 05, 2022  2:00 PM  Office visit with Laurence Aly, MD  Cardiology: Mendel Ryder (CVM Exam) [phone] [place].  [address] [place] [address]  [phone]            Signs and Symptoms:   Report these signs and symptoms     Report These Signs and Symptoms   As directed      Please contact your doctor if you have any of the following symptoms: temperature higher than 100.4 degrees F, uncontrolled pain, persistent nausea and/or vomiting, difficulty breathing, chest pain, severe abdominal pain, headache, unable to urinate, unable to have bowel movement, or drainage with a foul odor      , Education:  and Others Instructions:   Other Orders     Questions About Your Stay   Complete by: As directed      Discharging attending physician: Aurelio Brash    Order comments: For questions or concerns regarding your hospital stay, call [phone].            Additional Orders: Case Management, Supplies, Home Health     Home Health/DME              HOME HEALTH/DME  ONCE Comments: Home Health/Durable Medical Equipment Order Details    Patient Name:  Candace Keith                   Medical Record Number:   [id number]    Patient Diagnosis & ICD 10 Codes: ALS (amyotrophic lateral sclerosis)   ICD-10-CM: G12.21  Patient Height: 157.5 cm (5' 2)   Patient Weight: 48.7 kg (107 lb 5.8 oz)    Provider Information:  MD Name: Jana Hakim  MD NPI: [id number]        Agency Instructions:     Start of care within 24-48 hours of hospital discharge, anticipated discharge 10/06/2021. RN to complete general assessment, including vitals with temperature, monitor and teach patient and/or caregiver medication management and compliance, monitor and teach pain management and pain medication compliance when necessary. Monitor and teach signs and symptoms of infection, monitor and teach disease management, including signs and symptoms, diet, who and when to call, hospital readmission avoidance., Physical Therapy  to evaluate and treat  along with home safety evaluation.  Teach strategies for energy conservation, mobility training, strengthening, balance activities and address deficits, maximize function and improve safety.   and Occupational Therapy   to evaluate and treat along with home safety evaluation.  Teach strategies for energy conservation, mobility training, strengthening, balance activities and address deficits, maximize function and improve safety.  , Speech therapy to evaluate and treat patient., and I certify that this patient is under my care and that I, or a nurse practitioner or physician's assistant working with me, had a face-to-face encounter that meets the physician's face-to-face encounter requirements with this patient on 10/03/2021.     This patient is under my care, and I have initiated the establishment of the plan of care. This patient will be followed by a physician after discharge, who will periodically review the plan of care.   Clinical findings to support homebound status: Unsteady gait, Ambulates short distances only, Poor tolerance for activity, Assistance required to ambulate/transfer, Poor balance, and Extreme weakness and/or fatigue  Clinical findings to support home care services: Muscle weakness affecting functional activities, Balance deficits with risk for falling, Deficit in ADL's, Safety deficits, and Communication deficits   Question Answer Comment   Attending Name/Contact Jana Hakim, MD    PCP Name/Contact Nathaneil Canary Phone: [phone] Fax: [phone]    Home Health to Follow PCP            HOME HEALTH/DME  ONCE        Comments: Home Health/Durable Medical Equipment Order Details    Patient Name:  Candace Keith                   Medical Record Number:   [id number]    Patient Diagnosis & ICD 10 Codes: ALS (amyotrophic lateral sclerosis)   ICD-10-CM: G12.21  Patient Height: 157.5 cm (5' 2)   Patient Weight: 48.7 kg (107 lb 5.8 oz)    Provider Information:  MD Name: Jana Hakim, MD  MD NPI: [id number]      Equipment Information & Instructions:    Bed Assist Items   Diagnosis: ALS (amyotrophic lateral sclerosis)   ICD-10-CM: G12.21      Length of Need (0-99): 99  Bed Assist Items: Bedside Commode (3 in 1 commode) needed for home use Drop arm    Patient requires a bedside commode due to inability to access bathroom via wheelchair. Patient requires a drop arm for safe transfers.   Question:  Attending Name/Contact  Answer:  Jana Hakim, MD        HOME HEALTH/DME  ONCE        Comments: Home Health/Durable Medical Equipment Order Details    Patient Name:  Candace Keith                   Medical Record Number:   [id number]    Patient Diagnosis & ICD 10 Codes: ALS (amyotrophic lateral sclerosis)   ICD-10-CM: G12.21      Patient Height: 157.5 cm (5' 2)   Patient Weight: 48.7 kg (107 lb 5.8 oz)    Provider Information:  MD Name: Jana Hakim, MD  MD NPI: [id number]    Equipment Information & Instructions:    Patient required BiPAP for home use. Diagnosis: ALS (amyotrophic lateral sclerosis)   ICD-10-CM: G12.21  Length of Need (0-99): 99  IPAP Setting: 10  EPAP Setting: 5      Patient requires cough assist.  Patient requires home suction machine due to  having difficulties raising and clearing secretions.   Question:  Attending Name/Contact  Answer:  Jana Hakim, MD                     Signed:  Aurelio Brash, MD  10/06/2021      cc:  Primary Care Physician:  Nathaneil Canary   Verified    Referring physicians:  Self, Referral          Did we miss something? If additional records are needed, please fax a request on office letterhead to [phone]. Please include the patient's name, date of birth, fax number and type of information needed. Additional request can be made by email at [email or web address]. For general questions of information about electronic records sharing, call [phone].

## 2021-10-05 NOTE — Progress Notes
SPEECH-LANGUAGE PATHOLOGY  DAILY NOTE     10/05/21 1100   Motor Speech Goals   Therapy Activities Respiration   Articulation Oral reading - sentences;Minimal prompting - patient able to 50-74% of the time   Therapy Activity Comments Pt completed EMST exercises this date. Pt and family demonstrates understanding of protocol. Valve set to lowest setting (5ccH20). Pt able to produce sufficient pressure only with max cueing of generating enough respiratory support (for only ~1 seconds at a time). Rest of session focused on recording rote phrases on pt's phone for voice banking. Pt rehearsed each phrase 5-10 times before recording, requiring mod cues to generate enough respiratory support for better volume and articulation. Pt expresses satisfaction of following recorded phrases this date on her phone (I need water, I love you, Hi, how are you?, Thank you very much). Provided further education on AAC device fitting regarding outpatient ALS clinic referral.    Patient Will Demonstrate Adequate Respiratory Support to Improve Vocal Volume with Standby prompting - Patient able to > 90% of the time, prompting < 10%;Not met;Progressing   Patient Will Adhere to Strategies During Conversation to Improve Speech Intelligibility with Standby prompting - Patient able to > 90% of the time, prompting < 10%;Not met;Progressing   Patient Will Improve Speech Intelligibility for Communication with Minimum assist;Not met;Progressing   Verbal Expression Goals   Patient Will Use Nonverbal Communication Modalities to Express Wants and Needs with Standby prompting - Patient able to > 90% of the time, prompting < 10%;Met   Patient Will Express Basic Wants and Needs for Daily Living Skills with Minimum assist;Met     Assessment Pt continues to progress towards her goals. Pt requires consistent cueing re: motor speech for articulation and sufficient breath control. Tolerates regular diet well. Pt is happy with her progress in voice banking and looks forward to further her outpatient AAC referral.    Plan Discharge planned for tomorrow (5/29)     [place] MA, CF-SLP [place] [id number]

## 2021-10-05 NOTE — Progress Notes
OCCUPATIONAL THERAPY       10/05/21 1000   Time Calculation   Start Time 1015   Stop Time 1030   Time Calculation 15   $$ Professional Contact 1 unit   $$ Therapeutic Activity 1 unit - 15 min   Subjective   Subjective Pt seated in bedside chair and stated that she was looking forward to going home tomorrow.  Pt declining participation in ADLs this session and stated that she would prefer not to engage in OT session this date.  OT also spoke with pt's son who also feels prepared for discharge and reviewed equipment purchased for home.   Assessment   Assessment Patient and family report feeling prepared for discharge to home tomorrow.   Plan   Plan Comments Discharge to home tomorrow.   Goals for the stay   Pt will perform basic care and transfer with Minimum assistance;Progressing;Adequate for discharge;Achieved   Type of Note   Type of Note Discharge     Henrene Dodge, OTR

## 2021-10-06 DIAGNOSIS — E039 Hypothyroidism, unspecified: Secondary | ICD-10-CM

## 2021-10-06 DIAGNOSIS — G479 Sleep disorder, unspecified: Secondary | ICD-10-CM

## 2021-10-06 DIAGNOSIS — N6320 Unspecified lump in the left breast, unspecified quadrant: Secondary | ICD-10-CM

## 2021-10-06 DIAGNOSIS — E43 Unspecified severe protein-calorie malnutrition: Secondary | ICD-10-CM

## 2021-10-06 DIAGNOSIS — R1312 Dysphagia, oropharyngeal phase: Secondary | ICD-10-CM

## 2021-10-06 DIAGNOSIS — I10 Essential (primary) hypertension: Secondary | ICD-10-CM

## 2021-10-06 DIAGNOSIS — G8250 Quadriplegia, unspecified: Secondary | ICD-10-CM

## 2021-10-06 DIAGNOSIS — Z681 Body mass index (BMI) 19 or less, adult: Secondary | ICD-10-CM

## 2021-10-06 DIAGNOSIS — I340 Nonrheumatic mitral (valve) insufficiency: Secondary | ICD-10-CM

## 2021-10-06 DIAGNOSIS — J9610 Chronic respiratory failure, unspecified whether with hypoxia or hypercapnia: Secondary | ICD-10-CM

## 2021-10-06 DIAGNOSIS — R269 Unspecified abnormalities of gait and mobility: Secondary | ICD-10-CM

## 2021-10-06 DIAGNOSIS — R253 Fasciculation: Secondary | ICD-10-CM

## 2021-10-06 DIAGNOSIS — R471 Dysarthria and anarthria: Secondary | ICD-10-CM

## 2021-10-06 DIAGNOSIS — J984 Other disorders of lung: Secondary | ICD-10-CM

## 2021-10-06 NOTE — Progress Notes
Physical Medicine & Rehabilitation Progress Note       Today's Date:  10/06/2021  Admission Date: 09/26/2021  LOS: 10 days  Insurance: [facility] MEDICARE    Principal Problem:    ALS (amyotrophic lateral sclerosis) (HCC)  Active Problems:    Essential hypertension    Mitral valve insufficiency    Declining functional status    Severe malnutrition (HCC)    Risk for falls    Impaired mobility and activities of daily living    Dysarthria                       Assessment/Plan:     Candace Keith is a 81 y.o.  female admitted to [facility] on 09/26/2021 with the following issues: ALS    Rehabilitation Plan  Tentative discharge date: 10/06/2021  Rehabilitation: Patient will continue with comprehensive therapies including physical therapy, occupational therapy, speech & language pathology, specialized rehab nursing, neuropsychology and physiatry oversight.    Goals: at ambulation level, household mobility, Independent, Adequate for discharge  Recommended therapy after discharge: Home with Assistance, and, Home Health Setting  PT recommended equipment: Wheelchair - Manual, Wheelchair cushion (ramp)  OT recommended equipment: Transport planner, Commode, Grab bar(s), Shower hose    Daily Functional Update:  Transfers        Transfer: Assistive Device: Nurse, adult (10/05/2021  9:00 AM)    Transfer: Sit to stand assist level: Stand by assistance (10/05/2021  9:00 AM)    Transfer: Stand pivot assist level: Stand by assistance (10/05/2021  9:00 AM)    No data recorded   Gait/ Mobility        Gait: Assistive Device: Roller Walker (10/05/2021  9:00 AM)    GAIT: Assist Level: Stand by assistance (10/05/2021  9:00 AM)    Gait Distance: 150 feet (150) (10/05/2021  9:00 AM)    No data recorded  No data recorded   Toileting        Toileting Assist: Minimal Assist (10/03/2021  9:00 AM)    No data recorded  Toilet Transfer Assist: Minimum assistance (10/03/2021  9:00 AM)     Dressing LE Dressing Assist: Moderate Assist (10/03/2021  9:00 AM)    UE Dressing Assist: Stand By Assist (10/03/2021  9:00 AM)         Current Medical Problems/Risks of Medical Complications/Management    ALS  Tetraparesis, Dysarthria/bulbar weakness, Oropharyngeal dysphagia  Fasciculations, hyperreflexia  Fall Risk  Impaired mobility and ADL  - Riluzole has been discussed with patient and son wants to hold on medication addition   - PT/OT/SLP to eval and treat   - regular diet / thin liquids per SLP assessment 5/22  - Outpatient referral for ALS clinic when discharged (requested by neuro)  - Neuromuscular consulted 5/23 and will establish in outpatient ALS clinic  - Neuropsych to eval and treat  ?  Chronic respiratory failure  Neuromuscular restrictive lung disease  Sleep disturbance  - BIPAP at night in the hospital, plan for NIV at home per Neurology  - cough assist machine during the day prn, suction prn  - added melatonin 1mg  QHS prn 5/22  - sleep bundle ordered  - patient will require indefinite use of noninvasive ventilation at night, cough assist and suction as needed, due to progressive neuromuscular disease and chronic respiratory failure   > completed overnight pulse oximetry and ABG    HTN  - continue amlodipine 2.5mg  daily   ?  Hypothyroidism   - discontinued PTA synthroid (patient refusing, will d/c per patient request)  - Free T4 09/22/21 0.8-wnl, TSH 13.79 elevated  ?  Mitral regurgitation   - follows with Dr. Geronimo Boot with  cardiology  ?  Left breast mass  - f/u with oncologist outpatient  ?  Malnutrition Assessment  Malnutrition present on admission  ICD-10 code E43: Chronic illness/Severe malnutrition  ??????Weight loss: >?5% x 1 month, Energy intake: 75% or less of estimated energy requirement for 1 month or more ?  Malnutrition Interventions: Small frequent meals/snacks q2-3 hrs, 6 total. Meals BID + 1/2 carton of kate farms 1.4 or 1.0, 4x per day.  Our dietician will continue to follow.  ?  Pain Management: pain is well controlled, heat / ice PRN, topical medication and Tylenol PRN  ?  Skin: There are no pressure sores currently. and Encourage the patient to continue to inspect their skin and perform pressure relief.  ?  Bowel: continent of bowel  ?  Bladder: continent of bladder, voiding with negative admission BVI x3  ?  BMI: 18.55kg/m2  - Consult dietician for concerns of malnutrition.   ?  Mental Health: consult neuropsychology to provide support / counseling    DVT Prophylaxis: enoxaparin (LOVENOX) syringe 40 mg  QDAY(21)     Code status:  DNAR-FI    Subjective     No acute events overnight. Patient seen with family at bedside, excited for discharge today. Therapy notes reviewed, and patient has progressed to an overall functional level of SBA for mobility and ADLs. Patient is continent of bowel and bladder. Denies fever, chills, headache, chest pain, shortness of breath, nausea, dysuria, or diarrhea.    Objective                          Vital Signs: Last Filed                 Vital Signs: 24 Hour Range   BP: 145/84 (05/29 0559)  Temp: 36.4 ?C (97.5 ?F) (05/29 0559)  Pulse: 63 (05/29 0559)  Respirations: 18 PER MINUTE (05/29 0559)  SpO2: 96 % (05/29 0559)  O2 Device: None (Room air) (05/29 0559) BP: (142-145)/(77-84)   Temp:  [36.3 ?C (97.3 ?F)-36.4 ?C (97.5 ?F)]   Pulse:  [63-67]   Respirations:  [18 PER MINUTE]   SpO2:  [96 %-98 %]   O2 Device: None (Room air)     Vitals:    09/26/21 1311 09/29/21 0440 10/06/21 0600   Weight: 49 kg (108 lb 0.4 oz) 48.7 kg (107 lb 5.8 oz) 48.7 kg (107 lb 5.8 oz)       Intake/Output Summary:  (Last 24 hours)    Intake/Output Summary (Last 24 hours) at 10/06/2021 0810  Last data filed at 10/05/2021 1300  Gross per 24 hour   Intake 253 ml   Output --   Net 253 ml        Last Bowel Movement Date: 10/05/21    Labs--reviewed  No results found for this visit on 09/26/21 (from the past 24 hour(s)).    Medications:  Scheduled Meds:amLODIPine (NORVASC) tablet 2.5 mg, 2.5 mg, Oral, QDAY  enoxaparin (LOVENOX) syringe 40 mg, 40 mg, Subcutaneous, QDAY(21)  Protandim NAD Synergizer (Patient's Own Medication), 1 Dose, Oral, QDAY  Protandim NRF1 Synergizer (Patient's Own Medication), 1 Dose, Oral, QDAY  Protandim NRF2 Synergizer (Patient's Own Medication), 1 Dose, Oral, QDAY    Continuous Infusions:  PRN and Respiratory Meds:acetaminophen Q4H PRN, other medication PRN, bisacodyL QDAY PRN       Physical Exam  General: sitting up in stationary bike, NAD  HEENT: NCAT, oral mucosa moist   Heart: Extremities appear well perfused  Lungs: non labored breathing. Symmetrical chest expansion  Abdomen: non-distended  Extremities: no pedal edema  Neuro: Alert, follows commands. Appropriate processing time with conversation. Moves all limbs spontaneously, hypophonic and slow speech  Psych: pleasant mood, appropriate affect    Labs & Therapy Notes Reviewed      I personally observed the resident performing the E/M and discussed case with resident.    Aurelio Brash MD  Interventional Spine & MSK Medicine Fellow  PM&R

## 2021-10-06 NOTE — Case Management (ED)
Case Management Progress Note    NAME:Candace Keith                          MRN: [id number]              DOB:06-Oct-1940          AGE: 81 y.o.  ADMISSION DATE: 09/26/2021             DAYS ADMITTED: LOS: 10 days      Today's Date: 10/06/2021    PLAN: Discharge home today with home health thru [facility].     [facility] delivered drop arm BSC.    [facility] is providing the NIV, Cough Assist and suctioning. They will meet pt and son at pt's home on this afternoon for delivery and education.    Expected Discharge Date: 10/06/2021   Is Patient Medically Stable: Yes   Are there Barriers to Discharge? no    INTERVENTION/DISPOSITION:   Discharge Planning  o Plan for dc to home today.   o NCM faxed signed HH orders and AVS to [facility] agency.   o Previous CM arranged for delivery of NIV, cough assist, and suctioning for today to pt's home.   o No other NCM needs.                  Transportation                  Support                  Info or Referral                  Positive SDOH Domains and Potential Barriers                    Medication Needs                                                                                                                                                          Editor, commissioning                  Other                  Discharge Disposition  Selected Continued Care - Admitted Since 09/26/2021     [facility] Coordination complete.    Service Provider Selected Services Address Phone Fax Patient Preferred    [facility] [address], [place] [address] [phone] --                Sula Rumple, BSN RN  Nurse Case Manager  [employer]  Phone: [phone]  Pager: [phone]

## 2021-10-07 NOTE — Progress Notes
Candace Keith discharged on 10/06/2021.   Marland Kitchen  Discharge instructions reviewed with patient.  Valuables returned:    .  Home medications:    .  Functional assessment at discharge complete: Yes .  Patient declined to be escorted by  Staff and chose to be taken by two family members who were present at the time of discharge.

## 2021-10-08 ENCOUNTER — Encounter: Admit: 2021-10-08 | Discharge: 2021-10-08 | Payer: MEDICARE | Primary: Family

## 2021-10-08 NOTE — Progress Notes
RT/[facility] update regarding NIV:

## 2021-10-08 NOTE — Telephone Encounter
RN messaged [place] PSR regarding open appts in ALS Clinic. PSR reported playing phone tag with pt's point of contact, son/Peter. RN called son. No answer. RN LVM with appt date/time of Monday, 10/13/21, at 8:40 am and location. Text message sent via [employer] of appt date/time. RN also provided contact info for [facility] to setup equipment.    [facility] updated.         RN called pt, conference call with pt, daughter/Bridgette, and this RN. Daughter stated she was from [facility] and will be in town for the next few days to assist. RN informed both regarding appt scheduled. Daughter took note of this appt. RN informed both clinic and [facility] have left VMs with son for call back to discuss appt and set up of resp equipment. RN expressed concerns VMs may not have been received by son. RN stated it will be important for pt and family member to be present for set up of resp equipment (NIV and cough assist/suction) as education will also be provided. Daughter requested number for [facility]. RN provided call back number for Jess/RT. Daughter thankful. Daughter provided RN will cell and requested call back to either pt's number or call. RN provided contact info and update to Jess/RT.     VM and text to daughter regarding appt info and location.     Jess/RT reached out to both. No answer. VMs left. Jess/RT called pt's son as well. No answer. VM left. Son returned call; but RT was unable to pick up call. RT will go ahead to pt's house with equipment and set up despite no appt yet set.    ______________________________________________________________________    RN called pt's son. RN confirmed the following:   1) Appt in ALS clinic on 10/13/21 at 8:40 am.  2) Call back number provided to ALS MA at [phone].   3) Call back number provided to NM PSR at [phone].  4) Call back number provided to general neuro line at [phone].     RB by son.    Son confirmed to have received appt info and location via VM and text. Son inquired if later appt was possible. RN informed son, no as ASL clinic was only in the morning. All appts are scheduled for pts to arrive around the same time as appts can be ~4 hrs. RN advised son to bring, snacks, reading material, iPad, etc to help with passing time. RN informed son, pt will be seen by multi-disciplinary team, not only MD. Son verbalized understanding.     RN informed son, Meyer Cory will be heading to pt's home to set up resp equipment. It will be important for pt and family to be at home for the [facility] appt. He stated pt has home appts at 11 am and 3 pm; but pt and family will be available. RN informed son, Meyer Cory has about an hour and half drive; but timing may work out in-between appts. Son inquired which equipment will be provided. RN informed him, NIV and cough assist/suction. Son inquired as to why BiPAP was no longer being provided. RN informed son, this was not prescribed for ALS pts as BiPAP will continuously provide forced air to pts vs NIV which allows for adjustments based on pt's needs. Son verbalized understanding. RN messaged Jess/RT update.     Son thankful for call and denied further concerns.     IP CM and NM PSR messaged regarding discussion with son

## 2021-10-13 ENCOUNTER — Ambulatory Visit: Admit: 2021-10-13 | Discharge: 2021-10-13 | Payer: MEDICARE | Primary: Family

## 2021-10-13 ENCOUNTER — Encounter: Admit: 2021-10-13 | Discharge: 2021-10-13 | Payer: MEDICARE | Primary: Family

## 2021-10-13 DIAGNOSIS — R471 Dysarthria and anarthria: Secondary | ICD-10-CM

## 2021-10-13 DIAGNOSIS — G1221 Amyotrophic lateral sclerosis: Secondary | ICD-10-CM

## 2021-10-13 DIAGNOSIS — I10 Essential (primary) hypertension: Secondary | ICD-10-CM

## 2021-10-13 DIAGNOSIS — I340 Nonrheumatic mitral (valve) insufficiency: Secondary | ICD-10-CM

## 2021-10-13 LAB — LIVER FUNCTION PANEL
ALBUMIN: 4.2 g/dL (ref 3.5–5.0)
ALK PHOSPHATASE: 64 U/L (ref 25–110)
ALT: 20 U/L (ref 7–56)
AST: 21 U/L (ref 7–40)
TOTAL BILIRUBIN: 0.3 mg/dL (ref 0.3–1.2)
TOTAL PROTEIN: 7.4 g/dL (ref 6.0–8.0)

## 2021-10-13 MED ORDER — RELYVRIO 3-1 GRAM PO PWPK
PACK | ORAL | 5 refills | 28.00000 days | Status: AC
Start: 2021-10-13 — End: ?

## 2021-10-13 MED ORDER — RILUZOLE 50 MG PO TAB
50 mg | ORAL_TABLET | Freq: Two times a day (BID) | ORAL | 5 refills | 90.00000 days | Status: AC
Start: 2021-10-13 — End: ?

## 2021-10-13 NOTE — Progress Notes
MA contacted [facility] @ [phone] to ask a few questions about what they are doing with the patient and to see if they did a home safety evaluation. MA left message with Nurse Tammy.

## 2021-10-14 ENCOUNTER — Encounter: Admit: 2021-10-14 | Discharge: 2021-10-14 | Payer: MEDICARE | Primary: Family

## 2021-10-15 ENCOUNTER — Encounter: Admit: 2021-10-15 | Discharge: 2021-10-15 | Payer: MEDICARE | Primary: Family

## 2021-10-15 NOTE — Progress Notes
Relyvrio Enrollment form filled out and signed then faxed to [employer] ! [phone] with success    The following job has been successfully delivered to the specified destinations or intermediate server.  ----- Original Job Details -----  Product Name: Reinaldo Meeker MFP [id number]  User: Lorin Picket  Date: Jun/11/2021 10:46:21 AM (-0500 GMT)  Scanned Pages: 5  ----- Destination -----  [id number] Success  ----- Additional Details -----  Job   Date/Time                      Type  Line                           Identification  Duration  Pages  Result     1522  Jun/11/2021 10:38:42 AM         Send  Analog                         [id number]    07:32     5      Success

## 2021-10-15 NOTE — Progress Notes
Radicava ORS enrollment form, demos, and insurance faxed to [facility]:

## 2021-10-15 NOTE — Progress Notes
[facility] Pt ID:

## 2021-10-16 ENCOUNTER — Encounter: Admit: 2021-10-16 | Discharge: 2021-10-16 | Payer: MEDICARE | Primary: Family

## 2021-10-16 NOTE — Progress Notes
Radicava ORS enrollment form, demos and insurance securely forwarded to Jen[facility]. Clinicals dated 10/13/21 pending MD sig. Once signed, RN to submit to [facility]:       From: Alcario Drought Day <>   Sent: Thursday, October 16, 2021 12:39 PM  To: Sabino Dick <>  Cc: Lorin Picket <>  Subject: [External] RE: Candace Keith- ORS      Update from my team: is mandated to use [facility]     We will have to triage ?     Alcario Drought Day - Pharmacy Manager  [facility]  __________________________________________________________________    [facility] pt ID, HUB/triage info to [facility]:

## 2021-10-16 NOTE — Progress Notes
Radicava ORS education provided to pt via my chart.

## 2021-10-19 ENCOUNTER — Encounter: Admit: 2021-10-19 | Discharge: 2021-10-19 | Payer: MEDICARE | Primary: Family

## 2021-10-20 ENCOUNTER — Encounter: Admit: 2021-10-20 | Discharge: 2021-10-20 | Payer: MEDICARE | Primary: Family

## 2021-10-20 NOTE — Progress Notes
Clinicals from IP admission and OV dated 10/13/21 have been forwarded to Jen[facility] for Radicava ORS BI and PA request.

## 2021-10-20 NOTE — Progress Notes
The Prior Authorization for Relyvrio has been submitted for Candace Keith via Cover My Meds.  Will continue to follow.    Shelva Majestic  Pharmacy Patient Advocate  [phone]

## 2021-10-21 ENCOUNTER — Encounter: Admit: 2021-10-21 | Discharge: 2021-10-21 | Payer: MEDICARE | Primary: Family

## 2021-10-21 NOTE — Progress Notes
AAC device and speech therapy order faxed to [name] @ [phone] with success.    The following job has been successfully delivered to the specified destinations or intermediate server.  ----- Original Job Details -----  Product Name: Candace Keith MFP [id number]  User: Candace Keith  Date: Jun/04/2022 4:52:39 PM (-0500 GMT)  Scanned Pages: 12  ----- Destination -----  [id number] Success  ----- Additional Details -----  Job   Date/Time                      Type  Line                           Identification  Duration  Pages  Result     [id number]  Jun/04/2022 4:45:28 PM         Send  Analog                         [id number]    07:03     12     Success

## 2021-10-22 ENCOUNTER — Encounter: Admit: 2021-10-22 | Discharge: 2021-10-22 | Payer: MEDICARE | Primary: Family

## 2021-10-22 NOTE — Progress Notes
Radicava ORS PA request submitted via cover my meds. Clinicals and supporting article for use of Radicava ORS and Riluzole concurrently submitted.  Pending insurance review.

## 2021-10-23 ENCOUNTER — Encounter: Admit: 2021-10-23 | Discharge: 2021-10-23 | Payer: MEDICARE | Primary: Family

## 2021-10-23 NOTE — Progress Notes
RN followed up with Radicava ORS PA request and noted denial. RN to f/u with insurance:

## 2021-10-23 NOTE — Progress Notes
The Prior Authorization for Relyvrio was denied for [employer]. The insurance provided the following reason(s): see below.       If an alternative therapy is not appropriate and the prescribed medication and dose are necessary, the insurance will require the decision be appealed. This will require a letter of medical necessary. The provider's nurse was notified. To proceed with an appeal, completed appeal materials should be returned to the specialty pharmacy team or faxed to insurance at [phone].  Please contact the specialty pharmacy with any questions regarding next steps.    Shelva Majestic  Pharmacy Patient Advocate  [phone]

## 2021-10-24 ENCOUNTER — Encounter: Admit: 2021-10-24 | Discharge: 2021-10-24 | Payer: MEDICARE | Primary: Family

## 2021-10-24 NOTE — Telephone Encounter
RN called [facility] regarding Radicava ORS appeal and spoke with Gretchen/Rep. Rep stated this will need to be further discussed with appeals dept at [phone]. RN informed Rep, this was number used to call. Rep stated RN was transferred to incorrect dept. Rep provided RN with another number for appeals dept at [phone].    RN called this number and no options were offered for appeal. RN called [facility] for a second time and requested to speak with an advocate.     Member ID: [id number]      RN spoke with Vin/Rep regarding Radicava ORS. She stated documentation was needed for medical reason on why preferred med/Riluzole cannot be used. RN informed her, pt was ordered to start Riluzole and ORS as these two meds can be taken concurrently. There were no contraindications. Rep stated LMN will be needed to appeal indicating this. Urgent appeal can be faxed at [phone] (RB by this RN).     RN thankful and will start process for appeal.

## 2021-10-25 ENCOUNTER — Encounter: Admit: 2021-10-25 | Discharge: 2021-10-25 | Payer: MEDICARE | Primary: Family

## 2021-10-25 NOTE — Progress Notes
LMN to appeal Radicava ORS denial in process.

## 2021-10-26 ENCOUNTER — Encounter: Admit: 2021-10-26 | Discharge: 2021-10-26 | Payer: MEDICARE | Primary: Family

## 2021-10-26 NOTE — Progress Notes
LMN to appeal Radicava ORS denial, denial letter, and clinicals faxed to [facility] PBM:

## 2021-10-27 ENCOUNTER — Encounter: Admit: 2021-10-27 | Discharge: 2021-10-27 | Payer: MEDICARE | Primary: Family

## 2021-10-27 NOTE — Progress Notes
Fax received from [employer]/appeals at 3:20 pm regarding Radicava ORS clinical info needed:             The following form was completed and faxed, along with LMN to appeal ORS denial and article to support use of ORS and Riluzole together, to [facility]:

## 2021-10-28 ENCOUNTER — Encounter: Admit: 2021-10-28 | Discharge: 2021-10-28 | Payer: MEDICARE | Primary: Family

## 2021-10-28 NOTE — Telephone Encounter
Patient's son LVM for Candace Keith 10/24/21 at 1603 saying he wanted to check in with her and "touch base about some of the components of where we're at with this journey and what we're trying to learn about and make the best of." He can be reached at [phone].

## 2021-10-30 ENCOUNTER — Encounter: Admit: 2021-10-30 | Discharge: 2021-10-30 | Payer: MEDICARE | Primary: Family

## 2021-10-30 NOTE — Progress Notes
Pts son called asking for information about what his mom can do to better herself, he was asking about Holistic medications that will help cure his mom's ALS. MA told him that she will send a message to Dr. Braxton Feathers for his opinion. Son was also stating that pt is having trouble with her face mask for her NIV. MA contacted [facility] and they will be going to see the pt today.

## 2021-11-04 ENCOUNTER — Encounter: Admit: 2021-11-04 | Discharge: 2021-11-04 | Payer: MEDICARE | Primary: Family

## 2021-11-04 NOTE — Telephone Encounter
RN called [facility] to f/u on Radicava ORS appeal which was submited to [facility] on 10/26/21. RN spoke with Ace/Rep who noted denial was overturned:     1) Approval dates 6/14-12/31/23  2) [id number]    Test claim ran and paid. RN inquired if new current PA will also covefr maintenance doses or if new PA will be needed Sharing the same approval number. RN thankful for update.     Approval letter to be faxed to [phone]. RN will continue to monitor. Once received, RN will provide to [facility] (SP).     Tiffani and Jenn (both with [facility]) messaged regarding update.

## 2021-11-04 NOTE — Progress Notes
Fax received at 12:37 pm from [facility] regarding Radicava ORS PA approval. Letter forwarded to Tiffani and Candise Bowens (both with [facility], SP). Pt messaged regarding update:

## 2021-11-06 ENCOUNTER — Encounter: Admit: 2021-11-06 | Discharge: 2021-11-06 | Payer: MEDICARE | Primary: Family

## 2021-11-06 NOTE — Progress Notes
Appeal letter for Relyvrio faxed to [facility] @ [phone] with success.    The following job has been successfully delivered to the specified destinations or intermediate server.  ----- Original Job Details -----  Product Name: Reinaldo Meeker MFP [id number]  User: Lorin Picket  Date: Jun/28/2023 6:11:13 PM (-0500 GMT)  Scanned Pages: 13  ----- Destination -----  [id number] Success  ----- Additional Details -----  Job   Date/Time                      Type  Line                           Identification  Duration  Pages  Result     1951  Jun/28/2023 6:05:10 PM         Send  Analog                         [id number]    05:53     13     Success

## 2021-11-07 ENCOUNTER — Encounter: Admit: 2021-11-07 | Discharge: 2021-11-07 | Payer: MEDICARE | Primary: Family

## 2021-11-07 NOTE — Progress Notes
Denial letter faxed to [facility] Appeal Dept.@ [phone] with success.    The following job has been successfully delivered to the specified destinations or intermediate server.  ----- Original Job Details -----  Product Name: Reinaldo Meeker MFP [id number]  User: Lorin Picket  Date: Jun/29/2023 12:03:02 PM (-0500 GMT)  Scanned Pages: 2  ----- Destination -----  [id number] Success  ----- Additional Details -----  Job   Date/Time                      Type  Line                           Identification  Duration  Pages  Result     1963  Jun/29/2023 12:01:43 PM        Send  Analog                         [id number]    01:12     2      Success

## 2021-11-12 ENCOUNTER — Encounter: Admit: 2021-11-12 | Discharge: 2021-11-12 | Payer: MEDICARE | Primary: Family

## 2021-11-12 NOTE — Progress Notes
Denial and Appeal letter for [facility] faxed to [facility] again @ [phone] with success.    The following job has been successfully delivered to the specified destinations or intermediate server.  ----- Original Job Details -----  Product Name: Candace Keith MFP [id number]  User: Lorin Picket  Date: Jul/09/2021 8:32:15 AM (-0500 GMT)  Scanned Pages: 21  ----- Destination -----  [id number] Success  ----- Additional Details -----  Job   Date/Time                      Type  Line                           Identification  Duration  Pages  Result     2005  Jul/09/2021 8:23:04 AM          Send  Analog                         [id number]    09:01     21     Success

## 2021-11-25 ENCOUNTER — Encounter: Admit: 2021-11-25 | Discharge: 2021-11-25 | Payer: MEDICARE | Primary: Family

## 2021-11-25 NOTE — Telephone Encounter
Candace Keith, Candace Keith called about the ALS clinic.   DOB and legal full name confirmed for patient safety.  Pt permission to speak to Candace Keith was confirmed.   Office visit confirmed for 9.18.23.   Directions provided to [facility] with explanation of the ALSA clinic and what to expect.   Candace Keith was thankful for explanation.

## 2021-11-26 NOTE — Progress Notes
Long Term Care paperwork faxed to [facility] @ [phone] with success.    The following job has been successfully delivered to the specified destinations or intermediate server.  ----- Original Job Details -----  Product Name: Reinaldo Meeker MFP [id number]  User: Lorin Picket  Date: Jul/14/2023 6:15:08 PM (-0500 GMT)  Scanned Pages: 15  ----- Destination -----  [id number] Success  ----- Additional Details -----  Job   Date/Time                      Type  Line                           Identification  Duration  Pages  Result     2227  Jul/14/2023 6:08:47 PM         Send  Analog                         [id number]    06:14     15     Success

## 2021-12-12 ENCOUNTER — Encounter: Admit: 2021-12-12 | Discharge: 2021-12-12 | Payer: MEDICARE | Primary: Family

## 2021-12-17 ENCOUNTER — Encounter: Admit: 2021-12-17 | Discharge: 2021-12-17 | Payer: MEDICARE | Primary: Family

## 2021-12-17 NOTE — Progress Notes
SW completed a call to Candace Keith daughter who is listed as first emergency contact.    A request was placed by Candace Keith's son, Candace Keith, for FMLA paperwork to be completed, as he plans to move to [place], [place] to move in with her to be her primary caretaker 01/12/22.      Candace Keith relayed a complicated family dynamic with her brother, Candace Keith, who is currently living with Candace Keith, and providing care. The family no longer felt he should be the primary caretaker, and intervened, providing him with an eviction notice effective 01/12/22.  Candace Keith was able to affirm her agreement with this plan.      The most recent DPOA scanned to the chart is from 1999, naming Candace Keith and [place] as POAs.  There are several references by other siblings, who have reported they are DPOA, however, there is not an updated document to support these reports.  Candace Keith believes she has the most recent version, and will email the document to SW.       Candace Keith's next clinic appointment is 01/26/22, Candace Keith will be accompanying her to this appointment.  If an updated DPOA is completed prior to that date, she will bring it.  If not, the family will work with SW to get an updated version completed.      Candace Keith was encouraged to reach out with additional support needs.  SW contact phone and email were provided.

## 2022-01-01 ENCOUNTER — Encounter: Admit: 2022-01-01 | Discharge: 2022-01-01 | Payer: MEDICARE | Primary: Family

## 2022-01-01 NOTE — Progress Notes
SW completed phone call with Candace Keith, patient's daughter.      Candace Keith reached out to SW with concerns expressed by Candace Keith son, who is currently living with her.  Candace Keith sent a text at 1am on 8/24, stating Candace Keith had a locked jaw, inability to walk, and difficulty with her food and nutritional intake.  Candace Keith was able to talk to her Home Health agency, [facility], who reported her mom was weak and had 3 near falls last night, 12/31/21.  They also reported she was depressed about the "baby food" she was being fed.  Their reports were not consistent with Candace Keith's, especially with mobility assessment.      Candace Keith has been taking Candace Keith to see a chiropractor, Candace Keith, who practices holistic medicine.  She has spent $20,000 over the past few months on blood work and treatments.  Candace Keith has also added an IV supplement with Vesitrol (?), and an IV for added hydration.  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  This provider has also ordered her to eat meat only as of 12/09/21, prescribed for 2 months.  She ate 4 bison hot dogs at a family get together on 8/5.  On 12/26/21, Candace Keith, changed her diet to a liquid only diet, with protein shakes added for increased calorie intake and nutrition.      Candace Keith is frustrated with this provider's involvement and finds reports of Candace Keith's medical status as well as diet to be inconsistent, from [place].      Candace Keith's son, Candace Keith, will be moving from [place] to [place] to take over as a care provider on 01/09/22, with Candace Keith being evicted as of 01/12/22.  At that time, when others are in the home to provide consistent reports of her status, the family would like to reconnect with the ALS clinic.  They are interested in SLP and dietetics input on the best diet for Candace Keith to eat to support her safety and QOL.      SW to follow up with Candace Keith by phone on 01/13/22 at 7:30am.

## 2022-01-05 ENCOUNTER — Encounter: Admit: 2022-01-05 | Discharge: 2022-01-05 | Payer: MEDICARE

## 2022-01-05 DIAGNOSIS — I340 Nonrheumatic mitral (valve) insufficiency: Secondary | ICD-10-CM

## 2022-01-05 DIAGNOSIS — E43 Unspecified severe protein-calorie malnutrition: Secondary | ICD-10-CM

## 2022-01-05 DIAGNOSIS — R6884 Jaw pain: Secondary | ICD-10-CM

## 2022-01-05 DIAGNOSIS — I10 Essential (primary) hypertension: Secondary | ICD-10-CM

## 2022-01-05 DIAGNOSIS — G1221 Amyotrophic lateral sclerosis: Secondary | ICD-10-CM

## 2022-01-05 NOTE — Patient Instructions
Thank you for visiting our office today.    We would like to make the following medication adjustments: NONE      Otherwise continue the same medications as you have been doing.          We will be pursuing the following tests after your appointment today:       Orders Placed This Encounter    TMJ BILATERAL    AMB REFERRAL TO ENT       **To schedule testing call [phone]**      We will plan to see you back in 6 months.  Please call us in the meantime with any questions or concerns.        Please allow 5-7 business days for our providers to review your results. All normal results will go to MyChart. If you do not have Mychart, it is strongly recommended to get this so you can easily view all your results. If you do not have mychart, we will attempt to call you once with normal lab and testing results. If we cannot reach you by phone with normal results, we will send you a letter.  If you have not heard the results of your testing after one week please give [place] a call.       Your Cardiovascular Medicine [place] Team Brett Canales, Pilar Jarvis, Shawna Orleans, and [place])  phone number is [phone].

## 2022-01-13 ENCOUNTER — Encounter: Admit: 2022-01-13 | Discharge: 2022-01-13 | Payer: MEDICARE

## 2022-01-13 ENCOUNTER — Ambulatory Visit: Admit: 2022-01-13 | Discharge: 2022-01-13 | Payer: MEDICARE

## 2022-01-13 DIAGNOSIS — E43 Unspecified severe protein-calorie malnutrition: Secondary | ICD-10-CM

## 2022-01-13 DIAGNOSIS — R6884 Jaw pain: Secondary | ICD-10-CM

## 2022-01-13 DIAGNOSIS — I10 Essential (primary) hypertension: Secondary | ICD-10-CM

## 2022-01-13 DIAGNOSIS — G1221 Amyotrophic lateral sclerosis: Secondary | ICD-10-CM

## 2022-01-13 NOTE — Progress Notes
SW completed a scheduled f/u call with Marcelino Duster to check on Shavawn's status after Loistine Chance arrived to resume her care giving responsibilities on 01/09/22.  At this time, her son Theron Arista has not moved out, and the family is pursuing an eviction lawsuit.    Marcelino Duster reports that Shernita's ability to consume food appears to be slightly better over the past few days in comparison to a visit she had earlier in August.  Niasia continues to maintain a diet of primarily meats and protein shakes.      She had a visit with cardiology on 01/05/22, where her weight 97.8 lbs., her previous weight had been 110 lbs.  She was noted to have swelling in her feet and ankles, but no cardiac specific concerns were identified.  Marcelino Duster reports Jamirra's blood pressure was not of concern, She is taking 1/2 of her dosage, 3x/week.  Marcelino Duster was unsure if this was [employer] personal decision or suggested by her son Theron Arista.       Philip plans to contact the ENT to have her jaw evaluated, as she continues to have difficulty with movement.  He plans to call to get an appointment this week.      Marcelino Duster was encouraged to reach out with continued needs, and to encourage Loistine Chance to do the same.  Will f/u with the family when they come into the ALS clinic 01/26/22.

## 2022-01-14 ENCOUNTER — Encounter: Admit: 2022-01-14 | Discharge: 2022-01-14 | Payer: MEDICARE

## 2022-01-14 DIAGNOSIS — S0300XA Dislocation of jaw, unspecified side, initial encounter: Secondary | ICD-10-CM

## 2022-01-14 DIAGNOSIS — R6884 Jaw pain: Secondary | ICD-10-CM

## 2022-01-14 DIAGNOSIS — I10 Essential (primary) hypertension: Secondary | ICD-10-CM

## 2022-01-14 NOTE — Telephone Encounter
Received a call from Dr. Geronimo Boot stating that patient's TMJ is dislocated on the left side and possibly on the right. Dr. Geronimo Boot would like for patient to have a CT of maxillofacial structures and to get in with either ENT of plastic surgery for intervention.       Called and spoke with Candace Lewis, RN in plastic surgery. They can schedule patient to be seen 01/21/22. CT scan is scheduled same day prior to appointment.      Called and spoke to patient's daughter, Candace Keith. She confirmed D-T-L of appointments and has no further questions at this time. Educated her about when patient should seek urgent treatment. She verbalized understanding.

## 2022-01-21 ENCOUNTER — Ambulatory Visit: Admit: 2022-01-21 | Discharge: 2022-01-21 | Payer: MEDICARE

## 2022-01-21 ENCOUNTER — Encounter: Admit: 2022-01-21 | Discharge: 2022-01-21 | Payer: MEDICARE

## 2022-01-21 DIAGNOSIS — S0300XA Dislocation of jaw, unspecified side, initial encounter: Secondary | ICD-10-CM

## 2022-01-21 DIAGNOSIS — I340 Nonrheumatic mitral (valve) insufficiency: Secondary | ICD-10-CM

## 2022-01-21 DIAGNOSIS — G1221 Amyotrophic lateral sclerosis: Secondary | ICD-10-CM

## 2022-01-21 DIAGNOSIS — I10 Essential (primary) hypertension: Secondary | ICD-10-CM

## 2022-01-21 DIAGNOSIS — R6884 Jaw pain: Secondary | ICD-10-CM

## 2022-01-21 NOTE — Progress Notes
Plastic and Maxillofacial Surgery      CC:   Chief Complaint   Patient presents with   ? New Patient         HPI: Candace Keith, is an 81 y.o. female, with ALS presenting with her two sons and daughter for evaluation of TMJ dislocation. They report this happened during an event on August 9th but it is unclear what took place. Her children report that currently there is some conflict/disagreement among them and they apologized for that. Areli herself is able to communicate in writing but cannot speak. They are concerned that she has been losing weight and they describe that she has been on a liquid diet. One of her sons reports that her chiropracter reduced her jaw a couple of times and it continued to dislocation. The other son and daughter disagreed with this.     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     Family Hx: family history includes Fever Syndrome in her mother; Heart Disease in her mother; Heart Surgery (age of onset: 69) in her child; Heart Surgery (age of onset: 56) in her mother.     Social Hx:  reports that she has never smoked. She has never been exposed to tobacco smoke. She has never used smokeless tobacco. She reports that she does not drink alcohol and does not use drugs.      ROS:  Cardiovascular: Positive for leg swelling.   Neurological: Positive for weakness.   All other systems reviewed and are negative.    Meds:   has a current medication list which includes the following prescription(s): amlodipine, ginkgo biloba, grapefruit, lutein, other medication, other medication, other medication, other medication, other medication, other medication, other medication, other medication, other medication, other medication, and other medication.      Exam:  General: Alert, NAD  Focused maxillofacial/head and neck exam:  Scalp: No open wounds or tenderness  Eyes: PERRL, EOMI  Ears: symmetric, no rashes or lesions noted  External nose: no visible nasal deformity or wound  Internal nose: moist mucous membranes, septum intact  Oral cavity: tongue protrudes appropriately, no masses or bleeding; oral residue from recent meal; severe malocclusion, mandible anteriorly displaced and shifted to left of facial midline with anterior open bite  Oropharynx: soft palate elevates symmetrically  TMJ: no preauricular swelling or focal tenderness  Speech: no hypernasal resonance, appropriate articulation  Sensation: sensation to light touch symmetric and intact  Facial animation: symmetric, no visible facial weakness  Neck: soft, supple, trachea midline, no lymphadenopathy  Chest: Normal respiratory effort, no stridor  Hands: warm, without visible cyanosis or eschar    Imaging:   1. ?Anterior right temporomandibular joint dislocation in the   semiopen-mouth position.   2. 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 Explained none of these would guarantee a stable reduction and might require a feeding tube given her ALS  She is not interested in a feeding tube or being wired shut  Recommend observation at this time and  focusing on her nutrition/weight  Happy to see her in the future if we can be of help.  They had all their questions answered and were appreciative of the recommendations.

## 2022-01-26 ENCOUNTER — Encounter: Admit: 2022-01-26 | Discharge: 2022-01-26 | Payer: MEDICARE

## 2022-01-26 ENCOUNTER — Ambulatory Visit: Admit: 2022-01-26 | Discharge: 2022-01-27 | Payer: MEDICARE

## 2022-01-26 DIAGNOSIS — I10 Essential (primary) hypertension: Secondary | ICD-10-CM

## 2022-01-26 DIAGNOSIS — R471 Dysarthria and anarthria: Secondary | ICD-10-CM

## 2022-01-26 DIAGNOSIS — I340 Nonrheumatic mitral (valve) insufficiency: Secondary | ICD-10-CM

## 2022-01-26 DIAGNOSIS — G1221 Amyotrophic lateral sclerosis: Secondary | ICD-10-CM

## 2022-01-26 LAB — LIVER FUNCTION PANEL
ALBUMIN: 4.2 g/dL (ref 3.5–5.0)
ALK PHOSPHATASE: 65 U/L (ref 25–110)
ALT: 48 U/L (ref 7–56)
AST: 31 U/L (ref 7–40)
TOTAL BILIRUBIN: 0.5 mg/dL (ref 0.3–1.2)

## 2022-01-27 NOTE — Patient Instructions
Speech  Continue to follow through [facility]  Consider AAC training once you get device.     Hearing  No recommendations    Swallowing  Diet moist, minced and slightly thickened to mild thick liquids, Feeding Tube discussed/declined by pt, think "swallow", small bites/sips, good oral hygiene and continue to follow through [place] [facility]     1. Try a nosey cup to direction liquid to pt's mouth.  2. Try Reflo cup to limit the amount of liquid.  3. Try foods that are more cohesive with her meals. For example, a slightly thicker broth for her meats (I.e., like a beef consume consistency).   4. Try liquids that are naturally a little thicker/slower flow due to her open-mouth posture and pt could assist with liquid seal.   5. Cue pt to "swallow" when she has liquid in her mouth.  6. Try foods like mashed potatoes with butter and beef broth for a cohesive consistency.   Candace Keith, Doctor of [employer], SLP.D. CCC-SLP; [email or web address]; [phone]

## 2022-01-28 ENCOUNTER — Encounter: Admit: 2022-01-28 | Discharge: 2022-01-28 | Payer: MEDICARE

## 2022-01-28 NOTE — Progress Notes
Pt has withdrawn form the [facility] program for [place]. As of 01/26/22 [facility] will no longer conduct any addition support or outreach.

## 2022-01-29 ENCOUNTER — Encounter: Admit: 2022-01-29 | Discharge: 2022-01-29 | Payer: MEDICARE

## 2022-02-02 ENCOUNTER — Encounter: Admit: 2022-02-02 | Discharge: 2022-02-02 | Payer: MEDICARE

## 2022-02-02 NOTE — Progress Notes
Introduction to hospice services successfully faxed to [facility] @ [phone]    The following job has been successfully delivered to the specified destinations or intermediate server.  ----- [place] Details -----  Product Name: Viviana Simpler [place] [id number]  User: Linward Headland  Date: Sep/25/2023 4:36:59 PM (-0500 GMT)  Scanned Pages: 19  ----- Destination -----  [phone] Success  ----- Additional Details -----  Job   Date/Time                      Type  Line                           Identification  Duration  Pages  Result     [id number]  Sep/25/2023 4:27:57 PM         Send  Analog                         [phone]     08:52     19     Success

## 2022-02-02 NOTE — Progress Notes
Home health referral successfully faxed to [facility] home health @ [phone]    The following job has been successfully delivered to the specified destinations or intermediate server.  ----- [place] Details -----  Product Name: Viviana Simpler [place] [id number]  User: Linward Headland  Date: Sep/25/2023 4:44:30 PM (-0500 GMT)  Scanned Pages: 14  ----- Destination -----  [phone] Success  ----- Additional Details -----  Job   Date/Time                      Type  Line                           Identification  Duration  Pages  Result     [id number]  Sep/25/2023 4:36:57 PM         Send  Analog                         [id number]     07:21     14     Success

## 2022-02-12 ENCOUNTER — Encounter: Admit: 2022-02-12 | Discharge: 2022-02-12 | Payer: MEDICARE

## 2022-02-16 ENCOUNTER — Encounter: Admit: 2022-02-16 | Discharge: 2022-02-16 | Payer: MEDICARE

## 2022-02-17 ENCOUNTER — Encounter: Admit: 2022-02-17 | Discharge: 2022-02-17 | Payer: MEDICARE

## 2022-02-19 ENCOUNTER — Encounter: Admit: 2022-02-19 | Discharge: 2022-02-19 | Payer: MEDICARE

## 2022-02-19 NOTE — Progress Notes
Genetic Phone call set with Dr. Chyrl Civatte for Oct. 20th at 1pm.

## 2022-03-09 ENCOUNTER — Encounter: Admit: 2022-03-09 | Discharge: 2022-03-09 | Payer: MEDICARE

## 2022-03-09 DIAGNOSIS — G1221 Amyotrophic lateral sclerosis: Secondary | ICD-10-CM

## 2022-04-10 ENCOUNTER — Encounter: Admit: 2022-04-10 | Discharge: 2022-04-10 | Payer: MEDICARE

## 2022-04-10 DIAGNOSIS — G1221 Amyotrophic lateral sclerosis: Secondary | ICD-10-CM

## 2022-07-15 ENCOUNTER — Encounter: Admit: 2022-07-15 | Discharge: 2022-07-15 | Payer: MEDICARE

## 2022-07-16 ENCOUNTER — Encounter: Admit: 2022-07-16 | Discharge: 2022-07-16 | Payer: MEDICARE

## 2022-07-16 NOTE — Progress Notes
POST-MORTEM DOCUMENTATION    Name: Candace Keith   MRN: [id number]     DOB: Jan 11, 1941      Age: 82 y.o.  Admission Date: (Not on file)     LOS: 0 days     Date of Service: 07-21-22      Date of death if known:  July 19, 2022  Location of death, if known:Home  How were you notified?   email  Who notified us of death? Hospice provider    Was hospice involved? Yes  Name of hospice involved, if known: [facility]  Date of hospice admission, if known:     Other information:

## 2022-07-16 NOTE — Progress Notes
SW was notified by [employer] daughter, Candace Keith, that she passed in her sleep on 07/21/2022.  This was communicated to the ALS team per her request.

## 2022-08-10 DEATH — deceased

## 2023-03-01 ENCOUNTER — Encounter: Admit: 2023-03-01 | Discharge: 2023-03-01 | Payer: MEDICARE

## 2023-06-22 ENCOUNTER — Encounter: Admit: 2023-06-22 | Discharge: 2023-06-22 | Payer: MEDICARE
